# Patient Record
Sex: Male | Born: 1975 | Race: White | Hispanic: No | Marital: Married | Smoking: Never smoker
Health system: Southern US, Community
[De-identification: ages and names within clinical notes are randomized; demographics above are authoritative.]

## PROBLEM LIST (undated history)

## (undated) DIAGNOSIS — N289 Disorder of kidney and ureter, unspecified: Secondary | ICD-10-CM

## (undated) DIAGNOSIS — N200 Calculus of kidney: Secondary | ICD-10-CM

## (undated) HISTORY — PX: VASECTOMY: SHX75

## (undated) HISTORY — DX: Calculus of kidney: N20.0

## (undated) HISTORY — PX: LUNG SURGERY: SHX703

---

## 1997-08-10 HISTORY — PX: EYE SURGERY: SHX253

## 2004-07-20 ENCOUNTER — Emergency Department (HOSPITAL_COMMUNITY): Admission: EM | Admit: 2004-07-20 | Discharge: 2004-07-20 | Payer: Self-pay | Admitting: Emergency Medicine

## 2004-07-21 ENCOUNTER — Emergency Department (HOSPITAL_COMMUNITY): Admission: EM | Admit: 2004-07-21 | Discharge: 2004-07-22 | Payer: Self-pay | Admitting: Emergency Medicine

## 2004-08-09 ENCOUNTER — Emergency Department (HOSPITAL_COMMUNITY): Admission: EM | Admit: 2004-08-09 | Discharge: 2004-08-09 | Payer: Self-pay | Admitting: Emergency Medicine

## 2004-09-02 ENCOUNTER — Ambulatory Visit (HOSPITAL_COMMUNITY): Admission: RE | Admit: 2004-09-02 | Discharge: 2004-09-02 | Payer: Self-pay | Admitting: Internal Medicine

## 2004-09-02 ENCOUNTER — Inpatient Hospital Stay (HOSPITAL_COMMUNITY): Admission: EM | Admit: 2004-09-02 | Discharge: 2004-09-10 | Payer: Self-pay | Admitting: Internal Medicine

## 2004-09-05 ENCOUNTER — Ambulatory Visit: Payer: Self-pay | Admitting: Internal Medicine

## 2004-09-06 ENCOUNTER — Encounter (INDEPENDENT_AMBULATORY_CARE_PROVIDER_SITE_OTHER): Payer: Self-pay | Admitting: *Deleted

## 2004-09-06 ENCOUNTER — Encounter: Payer: Self-pay | Admitting: Cardiothoracic Surgery

## 2004-09-18 ENCOUNTER — Encounter: Admission: RE | Admit: 2004-09-18 | Discharge: 2004-09-18 | Payer: Self-pay | Admitting: Cardiothoracic Surgery

## 2004-10-02 ENCOUNTER — Encounter: Admission: RE | Admit: 2004-10-02 | Discharge: 2004-10-02 | Payer: Self-pay | Admitting: Cardiothoracic Surgery

## 2010-01-24 ENCOUNTER — Ambulatory Visit (HOSPITAL_COMMUNITY): Admission: RE | Admit: 2010-01-24 | Discharge: 2010-01-24 | Payer: Self-pay | Admitting: Gastroenterology

## 2010-01-24 ENCOUNTER — Ambulatory Visit: Payer: Self-pay | Admitting: Gastroenterology

## 2010-02-12 ENCOUNTER — Encounter (INDEPENDENT_AMBULATORY_CARE_PROVIDER_SITE_OTHER): Payer: Self-pay

## 2010-07-10 ENCOUNTER — Ambulatory Visit (HOSPITAL_COMMUNITY): Admission: RE | Admit: 2010-07-10 | Discharge: 2010-07-10 | Payer: Self-pay | Admitting: Internal Medicine

## 2010-08-15 ENCOUNTER — Emergency Department (HOSPITAL_COMMUNITY)
Admission: EM | Admit: 2010-08-15 | Discharge: 2010-08-15 | Payer: Self-pay | Source: Home / Self Care | Admitting: Emergency Medicine

## 2010-08-15 LAB — BASIC METABOLIC PANEL
BUN: 17 mg/dL (ref 6–23)
CO2: 29 mEq/L (ref 19–32)
Calcium: 9.1 mg/dL (ref 8.4–10.5)
Chloride: 101 mEq/L (ref 96–112)
Creatinine, Ser: 1.11 mg/dL (ref 0.4–1.5)
GFR calc Af Amer: >60 mL/min (ref >60)
GFR calc non Af Amer: >60 mL/min (ref >60)
Glucose, Bld: 102 mg/dL — ABNORMAL HIGH (ref 70–99)
Potassium: 3.9 mEq/L (ref 3.5–5.1)
Sodium: 138 mEq/L (ref 135–145)

## 2010-08-25 LAB — URINALYSIS, ROUTINE W REFLEX MICROSCOPIC
Bilirubin Urine: NEGATIVE
Leukocytes, UA: NEGATIVE
Nitrite: NEGATIVE
Protein, ur: NEGATIVE mg/dL
Specific Gravity, Urine: >1.03 — ABNORMAL HIGH (ref 1.005–1.030)
Urine Glucose, Fasting: NEGATIVE mg/dL
Urobilinogen, UA: 0.2 mg/dL (ref 0.0–1.0)
pH: 5.5 (ref 5.0–8.0)

## 2010-08-25 LAB — URINE MICROSCOPIC-ADD ON

## 2010-09-09 NOTE — Letter (Signed)
Summary: Plan of Care, Need to Discuss  [facility]  [address]   [place], [place] [address]   Phone: [phone]  Fax: [phone]    February 12, 2010  JAYTON POPELKA [address] [place], [place]  [address] February 04, 1976   Dear Mr. Stallone,   We are writing this letter to inform you of treatment plans and/or discuss your plan of care.  We have tried several times to contact you; however, we have yet to reach you.  We ask that you please contact our office for follow-up on your gastrointestinal issues.  We can  be reached at [phone] to schedule an appointment, or to speak with someone regarding your health care needs.  Please do not neglect your health.   Sincerely,    Cloria Spring LPN  [facility] Ph: [phone]    Fax: [phone]

## 2010-09-09 NOTE — Letter (Signed)
Summary: EGD ORDER  EGD ORDER   Imported By: Ave Filter 01/24/2010 12:17:57  _____________________________________________________________________  External Attachment:    Type:   Image     Comment:   External Document

## 2010-10-26 LAB — DIFFERENTIAL
Basophils Absolute: 0.1 10*3/uL (ref 0.0–0.1)
Basophils Relative: 1 % (ref 0–1)
Eosinophils Absolute: 0.2 10*3/uL (ref 0.0–0.7)
Eosinophils Relative: 3 % (ref 0–5)
Lymphocytes Relative: 24 % (ref 12–46)
Lymphs Abs: 1.7 10*3/uL (ref 0.7–4.0)
Monocytes Absolute: 0.6 10*3/uL (ref 0.1–1.0)
Monocytes Relative: 9 % (ref 3–12)
Neutro Abs: 4.4 10*3/uL (ref 1.7–7.7)
Neutrophils Relative %: 63 % (ref 43–77)

## 2010-10-26 LAB — CBC
HCT: 38.8 % — ABNORMAL LOW (ref 39.0–52.0)
Hemoglobin: 13.2 g/dL (ref 13.0–17.0)
MCHC: 34 g/dL (ref 30.0–36.0)
MCV: 91.8 fL (ref 78.0–100.0)
Platelets: 243 10*3/uL (ref 150–400)
RBC: 4.23 MIL/uL (ref 4.22–5.81)
RDW: 13 % (ref 11.5–15.5)
WBC: 7 10*3/uL (ref 4.0–10.5)

## 2010-10-26 LAB — T-HELPER CELLS (CD4) COUNT (NOT AT ARMC)
CD4 % Helper T Cell: 52 % (ref 33–55)
CD4 T Cell Abs: 940 uL (ref 400–2700)

## 2010-12-26 NOTE — H&P (Signed)
NAMERASHAD, AULD               ACCOUNT NO.:  [id number]   MEDICAL RECORD NO.:  [id number]          PATIENT TYPE:  INP   LOCATION:  A322                          FACILITY:  [facility]   PHYSICIAN:  Kingsley Callander. Ouida Sills, MD       DATE OF BIRTH:  [birth year]   DATE OF ADMISSION:  09/02/2004  DATE OF DISCHARGE:  LH                                HISTORY & PHYSICAL   CHIEF COMPLAINT:  Left back pain and cough.   HISTORY OF PRESENT ILLNESS:  This patient is a 35 year old white male who  presented with persistent left posterior chest pain and cough.  He has had  minimal sputum production.  He has coughed up a little bit of yellow sputum  a couple of times in the morning.  He is found to have a fever to 100.9 and  a chest x-ray showing an infiltrate with consolidation in the left lower  lobe.  He originally felt sick in early December.  He was seen in the  emergency room twice.  He was first diagnosed with a strep throat and was  treated Bicillin-LA.  He was seen a second time and diagnosed with a  pneumonia.  He had undergone a CT scan of the abdomen because of jaundice  and elevated transaminases.  His chest x-ray had shown no infiltrate.  His  CAT scan of the abdomen though revealed a left lower lobe infiltrate.  He  was treated with a course of Levaquin.  He now presents with persistent  cough which had been evaluated at an urgent care center last week.  He had  reportedly had a negative x-ray there.  I had seen him, on August 26, 2004,  and had treated him with Ketek 800 mg q.h.s. and Tussionex.  He does not  smoke.  He has no history of asthma or pulmonary problems.  His jaundice did  resolve after treatment of his pneumonia.  There had been a questionable  penicillin allergy as well.   PAST MEDICAL HISTORY:  Negative.   MEDICATIONS:  Recent Ketek and Tussionex only.   ALLERGIES:  PENICILLIN.   SOCIAL HISTORY:  He does not smoke cigarettes, abuse alcohol, or use drugs.   FAMILY HISTORY:   His mother had polymyositis.  His father has had coronary  heart disease.   REVIEW OF SYSTEMS:  Otherwise negative.   PHYSICAL EXAMINATION:  VITAL SIGNS:  Temperature 100.9.  GENERAL:  Uncomfortable, young, white male.  HEENT:  No scleral icterus.  TMs and pharynx unremarkable.  NECK:  Supple without adenopathy or thyromegaly.  LUNGS:  Dullness is present in the left base.  No wheezes.  HEART:  Regular at 100 beats per minute with no murmurs.  ABDOMEN:  Nontender.  No hepatosplenomegaly.  EXTREMITIES:  No cyanosis, clubbing, or edema.  NEURO:  Grossly intact.  LYMPH NODES:  No enlargement.  SKIN:  Normal.   IMPRESSION:  Left lower lobe pneumonia.   He will be hospitalized and treated with IV Rocephin and IV Zithromax.  Blood and sputum cultures will be obtained.  His oxygen saturation,  CBC, and  __________  are pending.      ROF/MEDQ  D:  09/02/2004  T:  09/02/2004  Job:  [id number]

## 2010-12-26 NOTE — Discharge Summary (Signed)
[name], [name]               ACCOUNT NO.:  [id number]   MEDICAL RECORD NO.:  [id number]          PATIENT TYPE:  INP   LOCATION:  6704                         FACILITY:  [facility]   PHYSICIAN:  Kingsley Callander. Ouida Sills, MD       DATE OF BIRTH:  1975/11/30   DATE OF ADMISSION:  09/02/2004  DATE OF DISCHARGE:  02/01/2006LH                                 DISCHARGE SUMMARY   DISCHARGE DIAGNOSES:  1.  Pneumonia.  2.  Multiloculated pleural effusions.  3.  Hypokalemia.  4.  Diarrhea.  5.  Gilbert's syndrome.   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 He was febrile to 100.9.  His chest x-ray revealed an infiltrate in the left lower lobe. He had  experienced persistent cough following treatment of a pneumonia in December.  He had developed jaundice then which resolved. He was hospitalized and  treated with IV Rocephin and IV Zithromax. His white count was 24.6. He was  hypokalemic at 3.1 but normalized to 3.8 with supplementation. His blood  cultures were negative. His initial bilirubin was 2.0 and improved to 1.6.   A CAT scan of the chest revealed a large left pleural effusion. He underwent  a thoracentesis by radiology. His white count on his pleural fluid was  10,600 with 89% neutrophils. The fluid was clear and yellow. The pleural  effusion could not be completely drained. Therefore, his case was discussed  with Dr. Tyrone Sage. It was felt that he had multiloculated areas of pleural  fluid. He was transferred to [facility] for additional treatment and  drainage of this fluid.      ROF/MEDQ  D:  10/03/2004  T:  10/03/2004  Job:  [id number]

## 2010-12-26 NOTE — H&P (Signed)
[name], [name] NO.:  [id number]   MEDICAL RECORD NO.:  [id number]          PATIENT TYPE:  INP   LOCATION:  2303                         FACILITY:  [facility]   PHYSICIAN:  Sheliah Plane, MD    DATE OF BIRTH:  04/08/1976   DATE OF ADMISSION:  09/05/2004  DATE OF DISCHARGE:                                HISTORY & PHYSICAL   The patient is admitted in transfer at the request of Dr. Carylon Perches, [facility], where the patient was admitted on September 02, 2004.   CHIEF COMPLAINT:  Left chest empyema with left back pain.   HISTORY OF PRESENT ILLNESS:  The patient is a 35 year old white male who has  no previous significant medical history who presented on July 20, 2004,  with sore throat and malaise.  He was seen in the emergency room at [facility] on several occasions on July 20, 2004 and again on July 22, 2004 and was given Bicillin.  The patient was noted to have prior to this a  penicillin allergy.  After the initial admission at [facility], he was noted  to have elevated transaminases and bilirubin up to 8.  A CT scan of the  abdomen was performed which showed a very minor left lower lobe infiltrate.  He was treated with a course of Levaquin.  In mid-January he was again  evaluated in the Urgent Care Center, but we do not have a chest x-ray at  that time.  He was again seen on August 26, 2004 and was treated with  Tussionex and Ketek.  Because of persistent left back pain, he saw Dr.  Ouida Sills, a chest x-ray was done and the patient was admitted because of  increasing left effusion.  A CT scan showed an area of complex loculated  effusion located in the left posterior chest cavity suggestive of empyema.  Attempt at thoracentesis was not successful.  The patient overall feels  better since admission on September 02, 2004 but continues to have a white  count of 21,000 and episodic fevers to 102.   PAST MEDICAL HISTORY:  Negative for any  previous pulmonary infections,  negative for diabetes.   MEDICATIONS:  Ketek and Tussionex recently.   ALLERGIES:  PENICILLIN.   SOCIAL HISTORY:  The patient works as a Environmental consultant, does not smoke,  does not use any illicit drugs.   FAMILY HISTORY:  His mother has polymyositis.  Father has had coronary  artery disease.   REVIEW OF SYSTEMS:  The patient denies any TIAs, amaurosis, has had some  loose stools since being on antibiotics.  Denies any blood in his stool.  Denies any pedal edema.  Other review of systems is negative.   PHYSICAL EXAMINATION:  On arrival at [facility], his temperature is 99,  blood pressure 135/55, room air O2 saturation is 98%, respiratory rate 20.   CT scan of the chest done at [facility] is reviewed, which shows left lower  lobe atelectasis/infiltrate with a large left pleural effusion which is  loculated, mildly enlarged pretracheal lymph nodes.  IMPRESSION:  1.  Patient with a left empyema following pneumonia, left lower lobe.  2.  History of transient elevation of bilirubin of unknown etiology.   PLAN:  1.  With the patient's persistent intermittent fevers and elevated white      count with left lower lobe atelectasis and complex pleural collection of      fluid, I have recommended that we proceed with left video-assisted      thoracoscopy and drainage of empyema cavity.  2.  Pulmonary consultation.  3.  Obtain appropriate cultures and adjust antibiotic therapy accordingly.   This has all been discussed with the patient and his family in detail, and  he is willing to proceed.  We will keep the patient n.p.o. tonight and  proceed first thing in the morning.      EG/MEDQ  D:  09/05/2004  T:  09/05/2004  Job:  [id number]   cc:   Kingsley Callander. Ouida Sills, MD  [address]  [place]  [place] [address]  Fax: [phone]

## 2010-12-26 NOTE — Op Note (Signed)
[name], [name]               ACCOUNT NO.:  [id number]   MEDICAL RECORD NO.:  [id number]          PATIENT TYPE:  INP   LOCATION:  2303                         FACILITY:  [facility]   PHYSICIAN:  Sheliah Plane, MD    DATE OF BIRTH:  1976/01/02   DATE OF PROCEDURE:  09/06/2004  DATE OF DISCHARGE:                                 OPERATIVE REPORT   PREOPERATIVE DIAGNOSIS:  Left chest empyema.   POSTOPERATIVE DIAGNOSIS:  Left chest empyema.   SURGICAL PROCEDURE:  Bronchoscopy, left video-assisted thoracoscopy, mini  thoracotomy with drainage of empyema and decortication of the left lung.   SURGEON:  Sheliah Plane, M.D.   BRIEF HISTORY:  The patient is a 35 year old male who presented with 6-week  long history of intermittent fevers, chest discomfort, and chest x-ray  consistent with pneumonia and empyema.  Bronchoscopy and VATS drainage of  empyema has been recommended.  The patient agreed and signed informed  consent.   DESCRIPTION OF PROCEDURE:  With central line and arterial line in place, the  patient underwent general endotracheal anesthesia without incident.  Through  the single lumen endotracheal tube, the fiberoptic bronchoscope was passed  to the subsegmental level.  There was some erythema of the bronchial mucosa  but no endobronchial lesions were appreciated, no areas of obstruction.  The  scope was removed.  The patient was turned in the lateral decubitus position  with the left side up.  Initially a small incision was made in the mid-  axillary line about the sixth intercostal space.  Through this a trocar was  placed into the left chest cavity.  There was extensive loculations  effusions throughout the space.  The lung did collapse nicely.  With the  scope, some of these loculations could be broken up.  A small counter-  incision was made more posteriorly over the majority of his involved area.  With the scope and through the small incision, decortication of the  lung and  pleural space was carried out with drainage.  Specimens of the fluid and  necrotic debris were sent to pathology.  The area was copiously irrigated.  Two 28 chest tubes were then left in placed to drain and brought out one  through the scope site and one separate incision.  A single intercostal  stitch was placed.  The muscle layers were closed with interrupted 0 Vicryl,  running 3-0 Vicryl in subcutaneous tissues, and 4-0 subcuticular stitches in  skin edges.  Dry dressing were applied.  Sponge and needle  count was reported as correct at the completion of the procedure.  The  patient tolerated the procedure without obvious complication.  Blood loss  was approximately 100 mL.  He was extubated in the operating room,  transferred to the recovery room for further postoperative care.      EG/MEDQ  D:  09/06/2004  T:  09/06/2004  Job:  [id number]   cc:   Kingsley Callander. Ouida Sills, MD  [address]  [place]  [place] [address]  Fax: [phone]

## 2010-12-26 NOTE — Discharge Summary (Signed)
[name], [name] NO.:  [id number]   MEDICAL RECORD NO.:  [id number]          PATIENT TYPE:  INP   LOCATION:  3301                         FACILITY:  [facility]   PHYSICIAN:  Sheliah Plane, MD    DATE OF BIRTH:  May 15, 1976   DATE OF ADMISSION:  09/05/2004  DATE OF DISCHARGE:  09/10/2004                                 DISCHARGE SUMMARY   HISTORY OF PRESENT ILLNESS:  The patient is a 35 year old male referred in  transfer to Sheliah Plane, M.D. from [facility] where he was  admitted on September 02, 2004 and diagnostic evaluation revealed an empyema  that was felt to require surgical intervention.  The patient was originally  seen in the emergency department on July 20, 2004 where he was lethargic  and found to have a strep throat and received Bicillin and was noted 2 days  later to have become jaundiced and also to have developed a pneumonia.  He  received Levaquin for 7 days.  He continued to have some lethargy,  nonproductive cough, and intermittent fevers over the next 3 weeks.  Approximately some time last week, he developed some back pain for which he  took Motrin.  He saw his primary care physician 1 week ago who placed him on  5 days of __________  and he returned to his primary care physician on  September 02, 2004 and was found to have pneumonia and he was admitted to  [facility] and placed on intravenous Zithromax and Rocephin.  A CT  scan was obtained and he was found to have an empyema and, as stated, was  felt to require transfer to [facility] for surgical evaluation and  treatment.   PAST MEDICAL HISTORY:  No significant medications prior to admission none  other than the above-mentioned antibiotics.   ALLERGIES:  PENICILLIN REPORTEDLY CAUSES VOMITING.   SOCIAL HISTORY:  He works as a Medical laboratory scientific officer.  Denies alcohol or  drugs.   FAMILY HISTORY:  Positive for early coronary disease, his mother has  muscular  dystrophy.   PHYSICAL EXAMINATION:  Please see the history and physical done at the time  of admission.   HOSPITAL COURSE:  The patient was admitted.  He was seen and evaluated by  Dr. Tyrone Sage who felt that the patient would require surgical intervention,  specifically bronchoscopy and left video assisted thoracoscopy with  decortication of the left lung.  This procedure was scheduled on September 06, 2004.  The patient tolerated the procedure well.  Cultures and pathology of  the pleural fluid were obtained.   POSTOPERATIVE HOSPITAL COURSE:  The patient has been stable postoperatively.  His chest tubes have been discontinued in a step-wise manner as drainage has  lowered.  His chest x-ray has revealed gradual improvement in appearance.  He has remained hemodynamically stable.  He has defervesced.  Cultures have  shown no growth of significance.  He has been treated with intravenous  Primaxin during the postoperative period.  He has responded well to  aggressive pulmonary toilet and oxygen saturations are good on  room air.  His pathology of the left pleural peel just shows marked suppurative  inflammation and fibrin.  These findings are consistent with empyema and no  definitive viable pleural tissue was identified.  The patient overall is  felt to be progressing well from a clinical viewpoint and tentatively is  scheduled for discharge on the morning of September 10, 2004, pending morning  round reevaluation.   DISCHARGE MEDICATIONS:  1.  Antibiotics to be determined at time of discharge.  2.  For pain, Tylox one to two q.4-6h. p.r.n.  3.  He also will be given a prescription for Tussionex p.r.n. 5 mL q.12h.   ACTIVITY:  He is instructed not to drive until cleared by the surgeon.  He  is to increase his walking daily as able and continue his breathing  exercises.   DIET:  Regular.   WOUND CARE:  The patient may shower and clean his incision gently with soap  and water.    FOLLOWUP:  Followup will be arranged and the patient will see Dr. Jaynie Collins in  1 week, Thursday, September 18, 2004 at 2:20 p.m. with a chest x-ray to be  obtained at the [facility] prior to this appointment.   CONDITION ON DISCHARGE:  Stable and improved.   FINAL DIAGNOSIS:  Left lung empyema probably due to community-acquired  pneumonia, all cultures thus far negative.      WEG/MEDQ  D:  09/09/2004  T:  09/09/2004  Job:  [id number]   cc:   Sheliah Plane, MD  [address]  [place]  [place] [address]   Charlaine Dalton. Sherene Sires, M.D. [facility]   Kingsley Callander. Ouida Sills, MD  [address]  [place]  [place] [address]  Fax: [phone]

## 2012-06-02 ENCOUNTER — Encounter (HOSPITAL_COMMUNITY): Payer: Self-pay

## 2012-06-02 ENCOUNTER — Emergency Department (HOSPITAL_COMMUNITY)
Admission: EM | Admit: 2012-06-02 | Discharge: 2012-06-02 | Disposition: A | Discharge status: Home / Self Care | Payer: 59 | Attending: Emergency Medicine | Admitting: Emergency Medicine

## 2012-06-02 DIAGNOSIS — Z79899 Other long term (current) drug therapy: Secondary | ICD-10-CM | POA: Insufficient documentation

## 2012-06-02 DIAGNOSIS — Z9889 Other specified postprocedural states: Secondary | ICD-10-CM | POA: Insufficient documentation

## 2012-06-02 DIAGNOSIS — N200 Calculus of kidney: Secondary | ICD-10-CM | POA: Insufficient documentation

## 2012-06-02 DIAGNOSIS — N289 Disorder of kidney and ureter, unspecified: Secondary | ICD-10-CM | POA: Insufficient documentation

## 2012-06-02 HISTORY — DX: Disorder of kidney and ureter, unspecified: N28.9

## 2012-06-02 LAB — URINALYSIS, ROUTINE W REFLEX MICROSCOPIC
Glucose, UA: NEGATIVE mg/dL
Leukocytes, UA: NEGATIVE
Protein, ur: NEGATIVE mg/dL
Urobilinogen, UA: 0.2 mg/dL (ref 0.0–1.0)

## 2012-06-02 LAB — BASIC METABOLIC PANEL
CO2: 24 mEq/L (ref 19–32)
Calcium: 9.5 mg/dL (ref 8.4–10.5)
GFR calc Af Amer: >90 mL/min (ref >90)
GFR calc non Af Amer: >90 mL/min (ref >90)
Sodium: 138 mEq/L (ref 135–145)

## 2012-06-02 LAB — URINE MICROSCOPIC-ADD ON

## 2012-06-02 MED ORDER — KETOROLAC TROMETHAMINE 30 MG/ML IJ SOLN
30.0000 mg | Freq: Once | INTRAMUSCULAR | Status: AC
  Administered 2012-06-02: 30 mg via INTRAVENOUS
  Filled 2012-06-02: qty 1

## 2012-06-02 MED ORDER — SODIUM CHLORIDE 0.9 % IV SOLN
Freq: Once | INTRAVENOUS | Status: AC
  Administered 2012-06-02: 10:00:00 via INTRAVENOUS

## 2012-06-02 MED ORDER — ONDANSETRON HCL 4 MG/2ML IJ SOLN
4.0000 mg | Freq: Once | INTRAMUSCULAR | Status: AC
  Administered 2012-06-02: 4 mg via INTRAVENOUS
  Filled 2012-06-02: qty 2

## 2012-06-02 MED ORDER — PROMETHAZINE HCL 25 MG PO TABS: 25.0000 mg | ORAL_TABLET | Freq: Four times a day (QID) | ORAL | Status: DC | PRN

## 2012-06-02 MED ORDER — OXYCODONE-ACETAMINOPHEN 5-325 MG PO TABS: 1.0000 | ORAL_TABLET | ORAL | Status: AC | PRN

## 2012-06-02 MED ORDER — HYDROMORPHONE HCL PF 1 MG/ML IJ SOLN
1.0000 mg | Freq: Once | INTRAMUSCULAR | Status: AC
  Administered 2012-06-02: 1 mg via INTRAVENOUS
  Filled 2012-06-02: qty 1

## 2012-06-02 NOTE — ED Notes (Signed)
Pt reports right flank pain since 8am today, +nausea no vomiting, h/o stones.

## 2012-06-02 NOTE — ED Notes (Signed)
Patient with no complaints at this time. Respirations even and unlabored. Skin warm/dry. Discharge instructions reviewed with patient at this time. Patient given opportunity to voice concerns/ask questions. IV removed per policy and band-aid applied to site. Patient discharged at this time and left Emergency Department with steady gait.  

## 2012-06-02 NOTE — ED Notes (Signed)
Patient given urinal and advised we needed a urine specimen. 

## 2012-06-02 NOTE — ED Provider Notes (Signed)
History     CSN: [id number]  Arrival date & time 06/02/12  4098   First MD Initiated Contact with Patient 06/02/12 [phone]      Chief Complaint  Patient presents with  . Flank Pain    (Consider location/radiation/quality/duration/timing/severity/associated sxs/prior treatment) HPI Comments: [name] presents for evaluation of sudden onset right flank pain occurring at 8 AM this morning in addition to inability to urinate.  He has had nausea without vomiting and denies fevers at this time.  He does have a history of kidney stones stating last had symptoms approximately 2 years ago.  He has never seen a urologist for this problem but has never had a problem passing stones.  He has taken no medications prior to arrival.  He denies abdominal pain and has had no changes in bowel habits including diarrhea or constipation.  Since his arrival he states his pain has changed location and is no longer in his right flank but has moved lower with radiation into his right testicle.  The history is provided by the patient.    Past Medical History  Diagnosis Date  . Renal disorder     Past Surgical History  Procedure Date  . Eye surgery   . Lung surgery   . Vasectomy     No family history on file.  History  Substance Use Topics  . Smoking status: Never Smoker   . Smokeless tobacco: Not on file  . Alcohol Use: Yes     occ      Review of Systems  Constitutional: Negative for fever.  HENT: Negative for congestion, sore throat and neck pain.   Eyes: Negative.   Respiratory: Negative for chest tightness and shortness of breath.   Cardiovascular: Negative for chest pain.  Gastrointestinal: Negative for nausea and abdominal pain.  Genitourinary: Positive for flank pain and difficulty urinating. Negative for frequency, hematuria, discharge and scrotal swelling.  Musculoskeletal: Negative for joint swelling and arthralgias.  Skin: Negative.  Negative for rash and wound.    Neurological: Negative for dizziness, weakness, light-headedness, numbness and headaches.  Hematological: Negative.   Psychiatric/Behavioral: Negative.     Allergies  Penicillins  Home Medications   Current Outpatient Rx  Name Route Sig Dispense Refill  . B-12 500 MCG PO TABS Oral Take 1 tablet by mouth 2 (two) times a week.    Marland Kitchen ESCITALOPRAM OXALATE 10 MG PO TABS Oral Take 5-10 mg by mouth daily.    . OXYCODONE-ACETAMINOPHEN 5-325 MG PO TABS Oral Take 1 tablet by mouth every 4 (four) hours as needed for pain. 20 tablet 0  . PROMETHAZINE HCL 25 MG PO TABS Oral Take 1 tablet (25 mg total) by mouth every 6 (six) hours as needed for nausea. 30 tablet 0    BP 111/55  Pulse 72  Temp 97.8 F (36.6 C) (Oral)  Resp 18  Ht 5\' 10"  (1.778 m)  Wt 180 lb (81.647 kg)  BMI 25.83 kg/m2  SpO2 100%  Physical Exam  Nursing note and vitals reviewed. Constitutional: He appears well-developed and well-nourished. He appears distressed.       Patient very uncomfortable upon first presentation, unable to sit still secondary to pain.  HENT:  Head: Normocephalic and atraumatic.  Eyes: Conjunctivae normal are normal.  Neck: Normal range of motion.  Cardiovascular: Normal rate, regular rhythm, normal heart sounds and intact distal pulses.   Pulmonary/Chest: Effort normal and breath sounds normal. He has no wheezes.  Abdominal: Soft. Normal appearance and  bowel sounds are normal. There is no tenderness. There is no rigidity, no guarding, no CVA tenderness, no tenderness at McBurney's point and negative Murphy's sign.       Pain located in lower right flank which is not exacerbated with palpation.    Musculoskeletal: Normal range of motion.  Neurological: He is alert.  Skin: Skin is warm and dry.  Psychiatric: He has a normal mood and affect.    ED Course  Procedures (including critical care time)  Labs Reviewed  URINALYSIS, ROUTINE W REFLEX MICROSCOPIC - Abnormal; Notable for the following:     Specific Gravity, Urine >1.030 (*)     Hgb urine dipstick LARGE (*)     Ketones, ur TRACE (*)     All other components within normal limits  BASIC METABOLIC PANEL - Abnormal; Notable for the following:    Glucose, Bld 106 (*)     All other components within normal limits  URINE MICROSCOPIC-ADD ON   No results found.   1. Kidney stone on right side     IV started and patient given Dilaudid 1 mg IV with mild improvement in pain, repeated Dilaudid x1 with again some improvement but not completely resolved.  Patient fairly drowsy, added Toradol 30 mg IV with complete resolution of symptoms.  MDM  Labs reviewed prior to discharge home and discussed with patient.  No UTI present.  Kidney function normal.  Discussed pros and cons of CT scan versus watchful waiting with patient and patient agrees with plan not to get a CT scan today.  Stones historically have been tiny as seen on prior CT scan, and since locus of pain has moved significantly during his ED visit, I suspect this is a small stone which should pass without any difficulty.  He was prescribed oxycodone and Phenergan for pain and nausea relief.  He was also referred to Dr. Jerre Simon for further management.  In the interim told patient that he should return here if he has increased pain, uncontrolled vomiting or if he develops fevers.  He was given a urine strainer and told to retain the stone if it passes for evaluation by Dr. Jerre Simon.  All questions were answered prior to discharge home.        Burgess Amor, [place] 06/02/12 1724

## 2012-06-02 NOTE — ED Notes (Signed)
Rated pain 8/10 at current, additional meds given for pain

## 2012-06-03 NOTE — ED Provider Notes (Signed)
Medical screening examination/treatment/procedure(s) were performed by non-physician practitioner and as supervising physician I was immediately available for consultation/collaboration.   Carleene Cooper III, MD 06/03/12 [phone]

## 2012-06-04 ENCOUNTER — Encounter (HOSPITAL_COMMUNITY): Payer: Self-pay | Admitting: *Deleted

## 2012-06-04 ENCOUNTER — Emergency Department (HOSPITAL_COMMUNITY): Payer: 59

## 2012-06-04 ENCOUNTER — Emergency Department (HOSPITAL_COMMUNITY)
Admission: EM | Admit: 2012-06-04 | Discharge: 2012-06-04 | Disposition: A | Discharge status: Home / Self Care | Payer: 59 | Attending: Emergency Medicine | Admitting: Emergency Medicine

## 2012-06-04 DIAGNOSIS — N200 Calculus of kidney: Secondary | ICD-10-CM | POA: Insufficient documentation

## 2012-06-04 DIAGNOSIS — Z87442 Personal history of urinary calculi: Secondary | ICD-10-CM | POA: Insufficient documentation

## 2012-06-04 DIAGNOSIS — N289 Disorder of kidney and ureter, unspecified: Secondary | ICD-10-CM | POA: Insufficient documentation

## 2012-06-04 LAB — URINALYSIS, ROUTINE W REFLEX MICROSCOPIC
Bilirubin Urine: NEGATIVE
Ketones, ur: NEGATIVE mg/dL
Nitrite: NEGATIVE
Protein, ur: NEGATIVE mg/dL
Urobilinogen, UA: 0.2 mg/dL (ref 0.0–1.0)

## 2012-06-04 MED ORDER — ONDANSETRON HCL 4 MG/2ML IJ SOLN
4.0000 mg | Freq: Once | INTRAMUSCULAR | Status: AC
  Administered 2012-06-04: 4 mg via INTRAVENOUS
  Filled 2012-06-04: qty 2

## 2012-06-04 MED ORDER — HYDROMORPHONE HCL PF 1 MG/ML IJ SOLN
1.0000 mg | Freq: Once | INTRAMUSCULAR | Status: AC
  Administered 2012-06-04: 1 mg via INTRAVENOUS
  Filled 2012-06-04: qty 1

## 2012-06-04 NOTE — ED Provider Notes (Signed)
Care assumed from Dr. Bebe Shaggy at shift change.  I agree with his note, assessment, and plan.  The patient was signed out to me awaiting the results of a ct scan for suspected renal calculus.  This was performed and revealed a partially-obstructing 1 mm stone at the right uvj.  He is feeling better with medications given.  He will be discharged with increased fluid intake, continued pain medications as prescribed.  Geoffery Lyons, MD 06/04/12 [phone]

## 2012-06-04 NOTE — ED Notes (Signed)
Pt was seen here on Thursday. Pt reports increase in pain that started during the night. Some nausea but has taken meds for nausea.

## 2012-06-04 NOTE — ED Provider Notes (Signed)
History     CSN: [id number]  Arrival date & time 06/04/12  7829   First MD Initiated Contact with Patient 06/04/12 [phone]      Chief Complaint  Patient presents with  . Flank Pain     Patient is a 36 y.o. male presenting with flank pain. The history is provided by the patient.  Flank Pain This is a recurrent problem. The current episode started 2 days ago. The problem occurs constantly. The problem has been gradually worsening. Pertinent negatives include no chest pain and no shortness of breath. Nothing aggravates the symptoms. The symptoms are relieved by narcotics. Treatments tried: narcotics. The treatment provided mild relief.  PT reports right flank pain.  He reports he has had this for two days.  He reports he was seen recently in the ER for this pain, and was told this was likely kidney stones.  He has h/o kidney stones but has never required urologic intervention.  He reports chills but no recorded fever.  He reports nausea/vomiting.  No cp/sob.  No focal weakness.  No lower abdominal/groin pain reported at this time  He reports he had some improvement in pain with oral narcotics but then the pain returned  Past Medical History  Diagnosis Date  . Renal disorder     Past Surgical History  Procedure Date  . Eye surgery   . Lung surgery   . Vasectomy     No family history on file.  History  Substance Use Topics  . Smoking status: Never Smoker   . Smokeless tobacco: Not on file  . Alcohol Use: Yes     occ      Review of Systems  Constitutional: Negative for fever.  Respiratory: Negative for shortness of breath.   Cardiovascular: Negative for chest pain.  Genitourinary: Positive for flank pain.  Neurological: Negative for weakness.  All other systems reviewed and are negative.    Allergies  Penicillins  Home Medications   Current Outpatient Rx  Name Route Sig Dispense Refill  . B-12 500 MCG PO TABS Oral Take 1 tablet by mouth 2 (two) times a week.      Marland Kitchen ESCITALOPRAM OXALATE 10 MG PO TABS Oral Take 5-10 mg by mouth daily.    . OXYCODONE-ACETAMINOPHEN 5-325 MG PO TABS Oral Take 1 tablet by mouth every 4 (four) hours as needed for pain. 20 tablet 0  . PROMETHAZINE HCL 25 MG PO TABS Oral Take 1 tablet (25 mg total) by mouth every 6 (six) hours as needed for nausea. 30 tablet 0    BP 111/72  Pulse 69  Temp 98.9 F (37.2 C) (Oral)  Resp 18  Ht 5\' 9"  (1.753 m)  Wt 180 lb (81.647 kg)  BMI 26.58 kg/m2  SpO2 100%  Physical Exam CONSTITUTIONAL: Well developed/well nourished, uncomfortable appearing HEAD AND FACE: Normocephalic/atraumatic EYES: EOMI/PERRL ENMT: Mucous membranes moist NECK: supple no meningeal signs SPINE:entire spine nontender CV: S1/S2 noted, no murmurs/rubs/gallops noted LUNGS: Lungs are clear to auscultation bilaterally, no apparent distress ABDOMEN: soft, nontender, no rebound or guarding HY:QMVHQ cva tenderness NEURO: Pt is awake/alert, moves all extremitiesx4 EXTREMITIES: pulses normal, full ROM SKIN: warm, color normal PSYCH: no abnormalities of mood noted  ED Course  Procedures  Labs Reviewed  URINALYSIS, ROUTINE W REFLEX MICROSCOPIC   6:53 AM Pt here for recurrent right flank pain.  Suspect worsening renal colic.  He reports similar to pain when he has been diagnosed with renal stones in past.  On his  recent ED visit his pain improved and no imaging ordered as he has h/o stones and had hematuria.  Will obtain CT imaging today due to return of pain/vomiting.  Will also recheck urine.  His recent BMP was reviewed and unremarkable   MDM  Nursing notes including past medical history and social history reviewed and considered in documentation Previous records reviewed and considered - recent ED visits reviewed         Joya Gaskins, MD 06/04/12 [phone]

## 2012-10-03 ENCOUNTER — Ambulatory Visit (INDEPENDENT_AMBULATORY_CARE_PROVIDER_SITE_OTHER): Payer: 59 | Admitting: Sports Medicine

## 2012-10-03 VITALS — BP 120/64 | Ht 70.0 in | Wt 180.0 lb

## 2012-10-03 DIAGNOSIS — M7702 Medial epicondylitis, left elbow: Secondary | ICD-10-CM

## 2012-10-04 ENCOUNTER — Other Ambulatory Visit: Payer: Self-pay | Admitting: *Deleted

## 2012-10-04 MED ORDER — DICLOFENAC SODIUM 75 MG PO TBEC: DELAYED_RELEASE_TABLET | ORAL | Status: DC

## 2012-10-04 NOTE — Progress Notes (Signed)
  Subjective:    Patient ID: [name], male    DOB: 09-13-1975, 37 y.o.   MRN: [id number]  HPI chief complaint: Left elbow pain  Very pleasant 37 year old right-hand-dominant male comes in today complaining of 2 months of medial sided left elbow pain. 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 He only inciting event that he can recall was using a post hole digger for several hours a day or 2 before he began to notice discomfort. Over the past 2 months he has experienced intermittent left elbow pain which is worse with activity. He's not noticed any swelling. He is getting some occasional numbness along the ulnar nerve distribution into his forearm and ulnar hand. This appears to be positional such as with driving or holding a phone. Repositioning the arm quickly alleviates his numbness. He is not experiencing any weakness. He has tried an elbow compression sleeve. Is also tried intermittent doses of over-the-counter ibuprofen. No neck pain. Denies pain along the lateral elbow. No prior elbow surgery.  Medications include Lexapro 10 mg daily He is allergic to penicillin Socially he does not smoke, drinks alcohol on occasional basis Occupation is as above   Review of Systems     Objective:   Physical Exam Well-developed, fit-appearing 37 year old male in no acute distress. Awake alert and oriented x3.  Left elbow: Full range of motion with no effusion. There is discrete tenderness to palpation over the medial epicondyle. No soft tissue swelling. No tenderness to palpation over the lateral epicondyle or over the olecranon. Negative Tinel's at the cubital tunnel. No tenderness to palpation along the forearm flexor mass. No atrophy. Good radial and ulnar pulses. Good grip strength. Sensation is intact to light touch grossly.  MSK ultrasound of the left elbow: A limited exam of the medial elbow was performed. Common flexor tendon is  intact the medial epicondyles without any obvious tearing. Some slight hypoechogenicity however suggests some inflammation at the origin. No spurs appreciated. There is increased neovascularity along the medial epicondyle suggesting a stress reaction here.      Assessment & Plan:  1. Left elbow pain secondary to medial epicondylitis  The ultrasound does not show any discrete tearing of the common flexor tendon but the increased neovascularity at the medial coupled with his physical exam finding are strongly supportive of the diagnosis of medial epicondylitis. We discussed treatment options today including oral anti-inflammatories or cortisone injection. We are going to try Voltaren 75 mg twice daily with food for 7 days. I've given him a counterforce brace to wear with activity and I've asked him to try to refrain from repetitive use of his left elbow until symptoms improve. He is given a set of eccentric home exercises. He will followup in 3 weeks but if symptoms do not respond initially to the oral anti-inflammatory he may return sooner for reconsideration of a cortisone injection.

## 2012-10-24 ENCOUNTER — Ambulatory Visit: Payer: 59 | Admitting: Sports Medicine

## 2012-12-21 ENCOUNTER — Ambulatory Visit: Payer: 59 | Admitting: Family Medicine

## 2012-12-26 ENCOUNTER — Ambulatory Visit (INDEPENDENT_AMBULATORY_CARE_PROVIDER_SITE_OTHER): Payer: 59 | Admitting: Sports Medicine

## 2012-12-26 VITALS — BP 120/70 | Ht 70.0 in | Wt 180.0 lb

## 2012-12-26 DIAGNOSIS — M7700 Medial epicondylitis, unspecified elbow: Secondary | ICD-10-CM

## 2012-12-26 DIAGNOSIS — M7702 Medial epicondylitis, left elbow: Secondary | ICD-10-CM

## 2012-12-26 DIAGNOSIS — G8929 Other chronic pain: Secondary | ICD-10-CM

## 2012-12-26 DIAGNOSIS — M25529 Pain in unspecified elbow: Secondary | ICD-10-CM

## 2012-12-27 NOTE — Progress Notes (Signed)
  Subjective:    Patient ID: [name], male    DOB: Mar 30, 1976, 37 y.o.   MRN: [id number]  HPI Patient comes in today with persistent medial left elbow pain. Symptoms have not been present for several months. Despite Voltaren, counterforce bracing, and a home exercise program his symptoms persist. He describes a burning sensation in the medial elbow. Present with activity. Still getting some intermittent numbness and tingling along the ulnar nerve distribution of his forearm and hand but he continues to be positional and is not any worse than it was previously. He is not noticed any significant weakness.  Interim medical history is unchanged He is allergic to penicillin    Review of Systems     Objective:   Physical Exam Well-developed, fit-appearing. No acute distress   Left elbow: Full range of motion. No effusion. No soft tissue swelling. There is discrete tenderness to palpation at the medial epicondyle with reproducible pain with resisted wrist flexion and ulnar deviation. No tenderness over the lateral epicondyle. Negative Tinel's at the cubital tunnel. No atrophy. Good radial and ulnar pulses. Good grip strength.       Assessment & Plan:  1. 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 Patient tolerated the procedure without difficulty. I've asked him to try to rest the elbow is much as possible over the next 5-7 days. He is to continue with his home exercises. An ultrasound at his last visit showed no obvious tearing of the common flexor tendon but if his symptoms persist I would consider merits of further diagnostic imaging in the form of an MRI to evaluate further. He will followup with me in 4 weeks or sooner if needed.  Consent obtained and verified. Time-out conducted. Noted no overlying  erythema, induration, or other signs of local infection. Skin prepped in a sterile fashion. Topical analgesic spray: Ethyl chloride. Joint: left medial elbow Needle: 25g 1 1/2 inch Completed without difficulty. Meds: 1cc (40mg ) depomedrol  Advised to call if fevers/chills, erythema, induration, drainage, or persistent bleeding.

## 2013-01-16 ENCOUNTER — Ambulatory Visit: Payer: 59 | Admitting: Sports Medicine

## 2014-09-10 DIAGNOSIS — R9431 Abnormal electrocardiogram [ECG] [EKG]: Secondary | ICD-10-CM | POA: Insufficient documentation

## 2016-06-22 ENCOUNTER — Encounter (INDEPENDENT_AMBULATORY_CARE_PROVIDER_SITE_OTHER): Payer: Self-pay | Admitting: Sports Medicine

## 2016-06-22 ENCOUNTER — Ambulatory Visit (INDEPENDENT_AMBULATORY_CARE_PROVIDER_SITE_OTHER): Payer: Commercial Managed Care - HMO | Admitting: Sports Medicine

## 2016-06-22 VITALS — BP 123/74 | HR 74 | Ht 70.0 in | Wt 200.0 lb

## 2016-06-22 DIAGNOSIS — M7701 Medial epicondylitis, right elbow: Secondary | ICD-10-CM

## 2016-06-22 NOTE — Progress Notes (Signed)
[name] - 40 y.o. male MRN [id number]  Date of birth: Oct 30, 1975  Office Visit Note: Visit Date: 06/22/2016 PCP: Purvis Kilts, MD Referred by: Sharilyn Sites, MD  Subjective: Chief Complaint  Patient presents with  . Right Elbow - Follow-up  . Follow-up    Right elbow pain for about 6 months.  Hurts to extend right arm out and hold something in the right hand.  Loss of strenght in right arm.    HPI: Worsening right medial elbow pain over the past 6 months. Significant worsening over the past one week. 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 He is had issues with similar problem on the left side several years ago that responded well to injection by Dr. Micheline Chapman. He presents today for evaluation of this pain given the fact that is been interfering with his day-to-day activities at work. He is unable to carry more than one 4 x 4 fencepost which is atypical for him. He has been using manual postal diggers senses auger has been broken. This does seem to flareup in symptoms. & He has tried anti-inflammatories magnesium rubs & relative rest without improvement. He denies any significant pain radiating down past the elbow.   ROS: Otherwise per HPI.  Assessment & Plan: Visit Diagnoses:  1. Medial epicondylitis, right elbow     Plan: Discussion regarding merits of cortisone injection versus other alternative treatment was discussed. Ultimately cortisone injection will be tried & continued relative rest over the next several days as encouraged. Encouraged icing afterwards. If any persistent ongoing symptoms can consider other alternative treatments including PRP, whole blood or topical NSAIDs. Meds: No orders of the defined types were placed in this encounter. Hand/UE Inj Date/Time: 06/22/2016 4:18 PM Performed by: Teresa Coombs D Authorized by: Teresa Coombs D   Condition: medial epicondylitis   Site:  R elbow Needle  Size:  22 G Medications:  1 mL bupivacaine 0.5 %; 1 mL lidocaine 1 %; 40 mg methylPREDNISolone acetate 40 MG/ML Comments: The patient's clinical condition is marked by substantial pain and/or significant functional disability. Other conservative therapy has not provided relief, is contraindicated, or not appropriate. There is a reasonable likelihood that injection will significantly improve the patient's pain and/or functional impairment.  After discussing the risks, benefits and expected outcomes of the injection and all questions were reviewed and answered, the patient wished to undergo the above named procedure.  Verbal consent was obtained. The target sight was prepped with alcohol scrub. Local anesthesia was obtained with ethyl chloride.  Under real-time ultrasound guidance, Injection of the target structure was performed using the above needle and medications. Band-Aid and 4" Ace Wrap was applied. The patient tolerated this procedure well with no immediate complications. Post injection instructions were provided.   Limited views of the medial condyle were obtained initially that did show increased neovascularity with interstitial tearing of the common flexor tendon insertion. No significant avulsion.      Follow-up: Return if symptoms worsen or fail to improve.   Clinical History: No specialty comments available.  He reports that he has never smoked. He does not have any smokeless tobacco history on file. No results for input(s): HGBA1C, LABURIC in the last 8760 hours.  Objective:  VS:  HT:5\' 10"  (177.8 cm)   WT:200 lb (90.7 kg)  BMI:28.8    BP:123/74  HR:74bpm  TEMP: ( )  RESP:  Physical Exam: Adult male. No acute  distress. Alert & appropriate. Right upper extremity's overall well aligned. Radial pulses 2+/4. Sensation intact. No significant deformity. No overlying skin changes. Marked tenderness palpation over the medial epicondyle. Pain with resisted wrist flexion & ulnar deviation.  No palpable defect.   Imaging: No results found.

## 2016-06-26 MED ORDER — LIDOCAINE HCL 1 % IJ SOLN
1.0000 mL | INTRAMUSCULAR | Status: AC | PRN
  Administered 2016-06-22: 1 mL

## 2016-06-26 MED ORDER — METHYLPREDNISOLONE ACETATE 40 MG/ML IJ SUSP
40.0000 mg | INTRAMUSCULAR | Status: AC | PRN
  Administered 2016-06-22: 40 mg

## 2016-06-26 MED ORDER — BUPIVACAINE HCL 0.5 % IJ SOLN
1.0000 mL | INTRAMUSCULAR | Status: AC | PRN
  Administered 2016-06-22: 1 mL

## 2016-07-06 DIAGNOSIS — R1314 Dysphagia, pharyngoesophageal phase: Secondary | ICD-10-CM | POA: Insufficient documentation

## 2016-07-06 DIAGNOSIS — K219 Gastro-esophageal reflux disease without esophagitis: Secondary | ICD-10-CM | POA: Insufficient documentation

## 2016-07-08 ENCOUNTER — Other Ambulatory Visit: Payer: Self-pay | Admitting: Otolaryngology

## 2016-07-08 DIAGNOSIS — K219 Gastro-esophageal reflux disease without esophagitis: Secondary | ICD-10-CM

## 2016-07-08 DIAGNOSIS — R1314 Dysphagia, pharyngoesophageal phase: Secondary | ICD-10-CM

## 2016-07-13 ENCOUNTER — Other Ambulatory Visit: Payer: Self-pay

## 2016-07-13 ENCOUNTER — Ambulatory Visit
Admission: RE | Admit: 2016-07-13 | Discharge: 2016-07-13 | Disposition: A | Discharge status: Home / Self Care | Payer: Commercial Managed Care - HMO | Source: Ambulatory Visit | Attending: Otolaryngology | Admitting: Otolaryngology

## 2016-07-13 DIAGNOSIS — R1314 Dysphagia, pharyngoesophageal phase: Secondary | ICD-10-CM

## 2016-07-13 DIAGNOSIS — K219 Gastro-esophageal reflux disease without esophagitis: Secondary | ICD-10-CM

## 2016-07-20 ENCOUNTER — Encounter: Payer: Self-pay | Admitting: Physician Assistant

## 2016-07-27 ENCOUNTER — Encounter: Payer: Self-pay | Admitting: Physician Assistant

## 2016-07-27 ENCOUNTER — Ambulatory Visit (INDEPENDENT_AMBULATORY_CARE_PROVIDER_SITE_OTHER): Payer: Commercial Managed Care - HMO | Admitting: Physician Assistant

## 2016-07-27 VITALS — BP 110/60 | HR 84 | Ht 70.0 in | Wt 210.0 lb

## 2016-07-27 DIAGNOSIS — R933 Abnormal findings on diagnostic imaging of other parts of digestive tract: Secondary | ICD-10-CM

## 2016-07-27 DIAGNOSIS — R1314 Dysphagia, pharyngoesophageal phase: Secondary | ICD-10-CM

## 2016-07-27 NOTE — Progress Notes (Signed)
Agree with assessment and plan as outlined.  

## 2016-07-27 NOTE — Progress Notes (Signed)
Chief Complaint: Dysphagia, Abnormal Esophagram  HPI:  [name] is a 40 year old Caucasian male who presents to clinic today accompanied by his wife who does assist with his history, he was referred to me by Dr. Constance Holster for a complaint of dysphagia and abnormal barium esophagram.   Barium esophagram 07/13/16 showed an area of narrowing at the junction of the proximal and mid portions of the thoracic esophagus. It appeared to be smooth no evidence of a shelf or web. Esophageal motility in this region appeared normal and liquid barium was ingested. There was no evidence of hiatal hernia or reflux.   Also per chart reviewed it appears patient had an EGD completed 01/29/10 for painful swallowing and was diagnosed with esophagitis, cannot make out who did this and there are no other notes from GI.     Today, the patient tells me that around 2003 he had "lung issues" and was intubated, he then had an EGD for painful swallowing after this which showed a lot of esophageal ulcers. The patient tells me it was unclear what these were from but was told that they could "scar over later". He tells me that most recently over the past 6 months he has been experiencing occasional dysphagia symptoms. Typically these are to meats. The patient has been able to avoid this over the past couple of weeks as he has been taking very small bites and chewing very well. He did have a recent barium esophagram, results as above, showing narrowing in his esophagus. The patient denies any other GI symptoms.   Patient denies fever, chills, blood in the stool, melena, change in bowel habits, weight loss, fatigue, nausea, vomiting, heartburn, reflux or abdominal pain.  Past Medical History:  Diagnosis Date  . Renal disorder     Past Surgical History:  Procedure Laterality Date  . EYE SURGERY    . LUNG SURGERY    . VASECTOMY      Current Outpatient Prescriptions  Medication Sig Dispense Refill  . Cyanocobalamin (B-12) 500 MCG  TABS Take 1 tablet by mouth 2 (two) times a week.    . diclofenac (VOLTAREN) 75 MG EC tablet Take twice daily with food for 7 days, then as needed. (Patient not taking: Reported on 06/22/2016) 60 tablet 0  . escitalopram (LEXAPRO) 10 MG tablet Take 5-10 mg by mouth daily.    . modafinil (PROVIGIL) 200 MG tablet     . promethazine (PHENERGAN) 25 MG tablet Take 1 tablet (25 mg total) by mouth every 6 (six) hours as needed for nausea. (Patient not taking: Reported on 06/22/2016) 30 tablet 0   No current facility-administered medications for this visit.     Allergies as of 07/27/2016 - Review Complete 06/26/2016  Allergen Reaction Noted  . Penicillins Other (See Comments) 06/02/2012    No family history on file.  Social History   Social History  . Marital status: Divorced    Spouse name: N/A  . Number of children: N/A  . Years of education: N/A   Occupational History  . Not on file.   Social History Main Topics  . Smoking status: Never Smoker  . Smokeless tobacco: Not on file  . Alcohol use Yes     Comment: occ  . Drug use: No  . Sexual activity: Yes    Birth control/ protection: Surgical   Other Topics Concern  . Not on file   Social History Narrative  . No narrative on file    Review of Systems:  Constitutional: No weight loss, fever, chills, weakness or fatigue Skin: No rash  Cardiovascular: No chest pain Respiratory: No SOB  Gastrointestinal: See HPI and otherwise negative Genitourinary: No dysuria or change in urinary frequency Neurological: No headache Musculoskeletal: No new muscle or joint pain Hematologic: No bleeding  Psychiatric: Positive for anxiety    Physical Exam:  Vital signs: BP 110/60   Pulse 84   Ht 5\' 10"  (1.778 m)   Wt 210 lb (95.3 kg)   BMI 30.13 kg/m    Constitutional:   Pleasant Caucasian male appears to be in NAD, Well developed, Well nourished, alert and cooperative Head:  Normocephalic and atraumatic. Eyes:   PEERL, EOMI. No  icterus. Conjunctiva pink. Ears:  Normal auditory acuity. Neck:  Supple Throat: Oral cavity and pharynx without inflammation, swelling or lesion.  Respiratory: Respirations even and unlabored. Lungs clear to auscultation bilaterally.   No wheezes, crackles, or rhonchi.  Cardiovascular: Normal S1, S2. No MRG. Regular rate and rhythm. No peripheral edema, cyanosis or pallor.  Gastrointestinal:  Soft, nondistended, nontender. No rebound or guarding. Normal bowel sounds. No appreciable masses or hepatomegaly. Rectal:  Not performed.  Msk:  Symmetrical without gross deformities. Without edema, no deformity or joint abnormality.  Neurologic:  Alert and  oriented x4;  grossly normal neurologically.  Skin:   Dry and intact without significant lesions or rashes. Psychiatric: Demonstrates good judgement and reason without abnormal affect or behaviors.  RECENT IMAGING:  ESOPHOGRAM / BARIUM SWALLOW / BARIUM TABLET STUDY 07/13/16  TECHNIQUE: Combined double contrast and single contrast examination performed using effervescent crystals, thick barium liquid, and thin barium liquid. The patient was observed with fluoroscopy swallowing a 13 mm barium sulphate tablet.  FLUOROSCOPY TIME:  Fluoroscopy Time:  1 minutes, 6 seconds  Radiation Exposure Index (if provided by the fluoroscopic device): 108 0.2 mGy  Number of Acquired Spot Images: 4+ 4 video loops  COMPARISON:  None in PACs  FINDINGS: The patient ingested the thick and thin barium and effervescent crystals without difficulty. The cervical esophagus distended well. There was no laryngeal penetration of the barium. The thoracic esophagus distended well to the liquid barium. No discrete stricture was observed. The mucosal fold pattern appeared normal. In the prone position esophageal motility was good. No reflux was observed.  Upon administration of the barium tablet passed promptly to the junction of the proximal and middle thirds  of the esophagus. Here it hung despite additional swallows of water as well as barium. Distal to this a few tertiary contractions were observed.  IMPRESSION: There is an area of narrowing at the junction of the proximal and midportions of the thoracic esophagus. It appears to be smooth with no evidence of a shelf or web. Esophageal motility through this region appears normal when liquid barium its ingested. Direct visualization is recommended.  No evidence of hiatal hernia or reflux.   Electronically Signed   By: David  Martinique M.D.   On: 07/13/2016 15:50  Assessment: 1. Dysphagia: See imaging above, likely related to esophageal narrowing/stricture, patient describes intermittent symptoms over the past 6 months, better with smaller bites and chewing well, history of esophagitis, last EGD in 2011, patient unsure of exact results 2. Esophageal narrowing: As above  Plan: 1. Recommend EGD with dilation. This was scheduled Dr. Havery Moros as he is the supervising physician today. Discussed risks, benefits, limitations and alternatives the patient agrees to proceed. This is scheduled in the [facility]. 2. Reviewed anti-dysphagia measures including taking small bites, chewing well, avoiding  distraction while eating, taking sips of water between bites and the chin tuck technique 3. Will not start patient on PPIs as it does not sound as though the symptoms are related to reflux, this can be discussed after time of his EGD. 4. Patient to follow in clinic for Dr. Doyne Keel  recommendations after time of procedure.  Jeffrey Newer, PA-C [facility] 07/27/2016, 9:25 AM  Cc: Dr. Izora Gala

## 2016-07-27 NOTE — Patient Instructions (Signed)

## 2016-08-11 ENCOUNTER — Encounter: Payer: Commercial Managed Care - HMO | Admitting: Gastroenterology

## 2016-08-19 ENCOUNTER — Ambulatory Visit (AMBULATORY_SURGERY_CENTER): Payer: Commercial Managed Care - HMO | Admitting: Gastroenterology

## 2016-08-19 ENCOUNTER — Encounter: Payer: Self-pay | Admitting: Gastroenterology

## 2016-08-19 VITALS — BP 103/66 | HR 59 | Temp 99.1°F | Resp 17 | Ht 70.0 in | Wt 210.0 lb

## 2016-08-19 DIAGNOSIS — K2281 Esophageal polyp: Secondary | ICD-10-CM

## 2016-08-19 DIAGNOSIS — K228 Other specified diseases of esophagus: Secondary | ICD-10-CM | POA: Diagnosis not present

## 2016-08-19 DIAGNOSIS — R1310 Dysphagia, unspecified: Secondary | ICD-10-CM | POA: Diagnosis not present

## 2016-08-19 DIAGNOSIS — K209 Esophagitis, unspecified without bleeding: Secondary | ICD-10-CM

## 2016-08-19 DIAGNOSIS — R1314 Dysphagia, pharyngoesophageal phase: Secondary | ICD-10-CM

## 2016-08-19 MED ORDER — SODIUM CHLORIDE 0.9 % IV SOLN: 500.0000 mL | INTRAVENOUS | Status: DC

## 2016-08-19 MED ORDER — OMEPRAZOLE 40 MG PO CPDR: 40.0000 mg | DELAYED_RELEASE_CAPSULE | Freq: Every day | ORAL | 1 refills | Status: DC

## 2016-08-19 NOTE — Progress Notes (Signed)
Called to room to assist during endoscopic procedure.  Patient ID and intended procedure confirmed with present staff. Received instructions for my participation in the procedure from the performing physician.  

## 2016-08-19 NOTE — Patient Instructions (Addendum)
YOU HAD AN ENDOSCOPIC PROCEDURE TODAY AT [facility]:   Refer to the procedure report that was given to you for any specific questions about what was found during the examination.  If the procedure report does not answer your questions, please call your gastroenterologist to clarify.  If you requested that your care partner not be given the details of your procedure findings, then the procedure report has been included in a sealed envelope for you to review at your convenience later.  YOU SHOULD EXPECT: Some feelings of bloating in the abdomen. Passage of more gas than usual.  Walking can help get rid of the air that was put into your GI tract during the procedure and reduce the bloating. If you had a lower endoscopy (such as a colonoscopy or flexible sigmoidoscopy) you may notice spotting of blood in your stool or on the toilet paper. If you underwent a bowel prep for your procedure, you may not have a normal bowel movement for a few days.  Please Note:  You might notice some irritation and congestion in your nose or some drainage.  This is from the oxygen used during your procedure.  There is no need for concern and it should clear up in a day or so.  SYMPTOMS TO REPORT IMMEDIATELY:   Following lower endoscopy (colonoscopy or flexible sigmoidoscopy):  Excessive amounts of blood in the stool  Significant tenderness or worsening of abdominal pains  Swelling of the abdomen that is new, acute  Fever of 100F or higher   Following upper endoscopy (EGD)  Vomiting of blood or coffee ground material  New chest pain or pain under the shoulder blades  Painful or persistently difficult swallowing  New shortness of breath  Fever of 100F or higher  Black, tarry-looking stools  For urgent or emergent issues, a gastroenterologist can be reached at any hour by calling [phone].   DIET:  We do recommend a small meal at first, but then you may proceed to your regular diet.  Drink  plenty of fluids but you should avoid alcoholic beverages for 24 hours.  ACTIVITY:  You should plan to take it easy for the rest of today and you should NOT DRIVE or use heavy machinery until tomorrow (because of the sedation medicines used during the test).    FOLLOW UP: Our staff will call the number listed on your records the next business day following your procedure to check on you and address any questions or concerns that you may have regarding the information given to you following your procedure. If we do not reach you, we will leave a message.  However, if you are feeling well and you are not experiencing any problems, there is no need to return our call.  We will assume that you have returned to your regular daily activities without incident.  If any biopsies were taken you will be contacted by phone or by letter within the next 1-3 weeks.  Please call us at [phone] if you have not heard about the biopsies in 3 weeks.    SIGNATURES/CONFIDENTIALITY: You and/or your care partner have signed paperwork which will be entered into your electronic medical record.  These signatures attest to the fact that that the information above on your After Visit Summary has been reviewed and is understood.  Full responsibility of the confidentiality of this discharge information lies with you and/or your care-partner.    Handouts were given to your care partner on esophagitis  and GERD. Recommend OMEPRAZOLE 40 mg twice daily for 2 weeks, then once daily thereafter.  Rx was sent to [facility]. You may resume your other current medications today. Await biopsy results. Please call if any questions or concerns.

## 2016-08-19 NOTE — Progress Notes (Signed)
No problems noted in the recovery room. maw 

## 2016-08-19 NOTE — Progress Notes (Signed)
A and O x3. Report to RN. Tolerated MAC anesthesia well.Teeth unchanged after procedure.

## 2016-08-20 ENCOUNTER — Telehealth: Payer: Self-pay | Admitting: *Deleted

## 2016-08-20 NOTE — Op Note (Addendum)
[place] Patient Name: [name] Procedure Date: 08/19/2016 3:16 PM MRN: [id number] Endoscopist: Remo Lipps P. Armbruster MD, MD Age: 41 Referring MD:  Date of Birth: [birth year] Gender: Male Account #: [id number] Procedure:                Upper GI endoscopy Indications:              Dysphagia, abnormal barium swallow Medicines:                Monitored Anesthesia Care Procedure:                Pre-Anesthesia Assessment:                           - Prior to the procedure, a History and Physical                            was performed, and patient medications and                            allergies were reviewed. The patient's tolerance of                            previous anesthesia was also reviewed. The risks                            and benefits of the procedure and the sedation                            options and risks were discussed with the patient.                            All questions were answered, and informed consent                            was obtained. Prior Anticoagulants: The patient has                            taken no previous anticoagulant or antiplatelet                            agents. ASA Grade Assessment: II - A patient with                            mild systemic disease. After reviewing the risks                            and benefits, the patient was deemed in                            satisfactory condition to undergo the procedure.                           After obtaining informed consent, the endoscope was  passed under direct vision. Throughout the                            procedure, the patient's blood pressure, pulse, and                            oxygen saturations were monitored continuously. The                            Model GIF-[id number]) scope was introduced                            through the mouth, and advanced to the second part                            of duodenum.  The upper GI endoscopy was                            accomplished without difficulty. The patient                            tolerated the procedure well. Scope In: Scope Out: Findings:                 Esophagogastric landmarks were identified: the                            Z-line was found at 38 cm, the gastroesophageal                            junction was found at 38 cm and the upper extent of                            the gastric folds was found at 38 cm from the                            incisors.                           Mucosal changes including ringed esophagus,                            longitudinal furrows and small-caliber esophagus                            were found in the entire esophagus. No focal                            stenosis / stricture was appreciated. Biopsies were                            obtained from the proximal, mid, and distal                            esophagus with cold  forceps for histology of                            suspected eosinophilic esophagitis.                           LA Grade B (one or more mucosal breaks greater than                            5 mm, not extending between the tops of two mucosal                            folds) esophagitis was found at the GEJ.                           A few small diminutive benign appearing polyps were                            found in the proximal esophagus. One representative                            polyp was removed with a cold biopsy forceps.                            Resection and retrieval were complete.                           One non-bleeding superficial gastric ulcer was                            found on the lesser curvature of the gastric body.                            The lesion was 4 mm in largest dimension.                           The exam of the stomach was otherwise normal.                           Biopsies were taken with a cold forceps in the                             gastric body and in the gastric antrum for                            Helicobacter pylori testing.                           The duodenal bulb and second portion of the                            duodenum were normal. Complications:            No immediate complications. Estimated blood loss:  Minimal. Estimated Blood Loss:     Estimated blood loss was minimal. Impression:               - Esophagogastric landmarks identified.                           - Esophageal mucosal changes suspicious for                            eosinophilic esophagitis. Biopsied.                           - LA Grade B reflux esophagitis.                           - Esophageal polyp(s) were found. Resected and                            retrieved.                           - Non-bleeding gastric ulcer.                           - Normal duodenal bulb and second portion of the                            duodenum.                           - Biopsies were taken with a cold forceps for                            Helicobacter pylori testing.                           Overall, findings suggestive for eosinophilic                            esophagitis causing the patient's symptoms. Recommendation:           - Patient has a contact number available for                            emergencies. The signs and symptoms of potential                            delayed complications were discussed with the                            patient. Return to normal activities tomorrow.                            Written discharge instructions were provided to the                            patient.                           -  Resume previous diet.                           - Continue present medications.                           - If not on any antacids, recommend omeprazole 40mg                             twice daily for 2 weeks, then once daily thereafter                           - Await  pathology results.                           - Pending pathology results we may repeat upper                            endoscopy in 8 weeks to evaluate the response to                            therapy.                           - No NSAIDs Carlota Raspberry. Armbruster MD, MD 08/19/2016 3:58:08 PM This report has been signed electronically.

## 2016-08-20 NOTE — Telephone Encounter (Signed)
  Follow up Call-  Call back number 08/19/2016  Post procedure Call Back phone  # [phone]  Permission to leave phone message Yes  Some recent data might be hidden     Patient questions:  Do you have a fever, pain , or abdominal swelling? No. Pain Score  0 *  Have you tolerated food without any problems? Yes.    Have you been able to return to your normal activities? Yes.    Do you have any questions about your discharge instructions: Diet   No. Medications  No. Follow up visit  No.  Do you have questions or concerns about your Care? No.  Actions: * If pain score is 4 or above: No action needed, pain <4.

## 2016-09-01 NOTE — Progress Notes (Signed)
Letter mailed

## 2016-10-21 ENCOUNTER — Encounter: Payer: Self-pay | Admitting: Gastroenterology

## 2017-02-18 ENCOUNTER — Other Ambulatory Visit: Payer: Self-pay | Admitting: Gastroenterology

## 2017-02-18 DIAGNOSIS — R1314 Dysphagia, pharyngoesophageal phase: Secondary | ICD-10-CM

## 2017-02-18 DIAGNOSIS — K209 Esophagitis, unspecified without bleeding: Secondary | ICD-10-CM

## 2017-02-18 DIAGNOSIS — K2281 Esophageal polyp: Secondary | ICD-10-CM

## 2017-02-18 DIAGNOSIS — K228 Other specified diseases of esophagus: Secondary | ICD-10-CM

## 2017-02-25 ENCOUNTER — Other Ambulatory Visit: Payer: Self-pay | Admitting: Gastroenterology

## 2017-02-25 DIAGNOSIS — K228 Other specified diseases of esophagus: Secondary | ICD-10-CM

## 2017-02-25 DIAGNOSIS — K209 Esophagitis, unspecified without bleeding: Secondary | ICD-10-CM

## 2017-02-25 DIAGNOSIS — R1314 Dysphagia, pharyngoesophageal phase: Secondary | ICD-10-CM

## 2017-02-25 DIAGNOSIS — K2281 Esophageal polyp: Secondary | ICD-10-CM

## 2017-06-22 LAB — CBC AND DIFFERENTIAL
HCT: 43 (ref 41–53)
HEMOGLOBIN: 14.7 (ref 13.5–17.5)
NEUTROS ABS: 2778
PLATELETS: 384 (ref 150–399)
WBC: 5.6

## 2017-06-22 LAB — BASIC METABOLIC PANEL
Potassium: 4.8 (ref 3.4–5.3)
Sodium: 139 (ref 137–147)

## 2017-06-22 LAB — HEPATIC FUNCTION PANEL
ALK PHOS: 39 (ref 25–125)
ALT: 23 (ref 10–40)
AST: 23 (ref 14–40)
BILIRUBIN, TOTAL: 1.3

## 2017-06-22 LAB — LIPID PANEL
Cholesterol: 202 — AB (ref 0–200)
HDL: 57 (ref 35–70)
LDL CALC: 128
TRIGLYCERIDES: 77 (ref 40–160)

## 2017-08-26 LAB — PULMONARY FUNCTION TEST

## 2017-10-04 ENCOUNTER — Ambulatory Visit (INDEPENDENT_AMBULATORY_CARE_PROVIDER_SITE_OTHER): Payer: 59 | Admitting: Family Medicine

## 2017-10-04 ENCOUNTER — Encounter: Payer: Self-pay | Admitting: Family Medicine

## 2017-10-04 VITALS — BP 124/72 | HR 67 | Temp 98.9°F | Ht 70.0 in | Wt 213.8 lb

## 2017-10-04 DIAGNOSIS — Z0001 Encounter for general adult medical examination with abnormal findings: Secondary | ICD-10-CM | POA: Diagnosis not present

## 2017-10-04 DIAGNOSIS — F325 Major depressive disorder, single episode, in full remission: Secondary | ICD-10-CM

## 2017-10-04 DIAGNOSIS — Z114 Encounter for screening for human immunodeficiency virus [HIV]: Secondary | ICD-10-CM

## 2017-10-04 DIAGNOSIS — Z23 Encounter for immunization: Secondary | ICD-10-CM | POA: Diagnosis not present

## 2017-10-04 DIAGNOSIS — F419 Anxiety disorder, unspecified: Secondary | ICD-10-CM

## 2017-10-04 NOTE — Addendum Note (Signed)
Addended by: Elmer Bales on: 10/04/2017 03:56 PM   Modules accepted: Orders

## 2017-10-04 NOTE — Assessment & Plan Note (Signed)
Stable.  Continue lexapro.  

## 2017-10-04 NOTE — Addendum Note (Signed)
Addended by: Kayren Eaves T on: 10/04/2017 04:33 PM   Modules accepted: Orders

## 2017-10-04 NOTE — Patient Instructions (Signed)

## 2017-10-04 NOTE — Progress Notes (Signed)
Subjective:  [name] is a 42 y.o. male who presents today for his annual comprehensive physical exam and to establish care.   HPI:  He has no acute complaints today.   His stable, chronic conditions are summarized below: 1. Depression/Anxiety. Stable on lexapro for several years. No SI or HI.  Lifestyle Diet: No specific diet. Exercise: Ride bikes. Ride about 3 times per week. 15 miles at a time.   Depression screen PHQ 2/9 10/04/2017  Decreased Interest 0  Down, Depressed, Hopeless 0  PHQ - 2 Score 0   Health Maintenance Due  Topic Date Due  . HIV Screening  10/31/1990  . TETANUS/TDAP  10/31/1994    ROS: Per HPI, otherwise a complete review of systems was negative.   PMH:  The following were reviewed and entered/updated in epic: Past Medical History:  Diagnosis Date  . Kidney stone   . Renal disorder    Patient Active Problem List   Diagnosis Date Noted  . Major depression in remission ([place]) 10/04/2017  . Anxiety disorder 10/04/2017   Past Surgical History:  Procedure Laterality Date  . EYE SURGERY Bilateral 1999   Lasik  . LUNG SURGERY     from reaction to penicillin  . VASECTOMY     Family History  Problem Relation Age of Onset  . Muscular dystrophy Mother   . Diabetes Father   . Colon cancer Neg Hx   . Colon polyps Neg Hx   . Esophageal cancer Neg Hx   . Rectal cancer Neg Hx   . Stomach cancer Neg Hx     Medications- reviewed and updated Current Outpatient Medications  Medication Sig Dispense Refill  . escitalopram (LEXAPRO) 10 MG tablet Take 5-10 mg by mouth daily.    . modafinil (PROVIGIL) 200 MG tablet     . niacin 500 MG tablet Take 500 mg by mouth daily.    Marland Kitchen omeprazole (PRILOSEC) 40 MG capsule TAKE ONE CAPSULE BY MOUTH DAILY. TAKE TWICE DAILY FOR TWO WEEKS, THENONCE DAILY THEREAFTER 90 capsule 3  . vitamin B-12 (CYANOCOBALAMIN) 250 MCG tablet Take 250 mcg by mouth daily.     No current facility-administered medications for this  visit.    Allergies-reviewed and updated Allergies  Allergen Reactions  . Penicillins Other (See Comments)    Kidney and lung failure   Social History   Socioeconomic History  . Marital status: Married    Spouse name: Janett Billow  . Number of children: 2  . Years of education: None  . Highest education level: None  Social Needs  . Financial resource strain: None  . Food insecurity - worry: None  . Food insecurity - inability: None  . Transportation needs - medical: None  . Transportation needs - non-medical: None  Occupational History  . Occupation: IT trainer- [employer]  Tobacco Use  . Smoking status: Never Smoker  . Smokeless tobacco: Never Used  Substance and Sexual Activity  . Alcohol use: Yes    Alcohol/week: 0.6 oz    Types: 1 Cans of beer per week    Comment: occ  . Drug use: No  . Sexual activity: Yes    Partners: Female    Birth control/protection: Surgical  Other Topics Concern  . None  Social History Narrative  . None   Objective:  Physical Exam: BP 124/72 (BP Location: Left Arm, Patient Position: Sitting, Cuff Size: Normal)   Pulse 67   Temp 98.9 F (37.2 C) (Oral)   Ht 5'  10" (1.778 m)   Wt 213 lb 12.8 oz (97 kg)   SpO2 97%   BMI 30.68 kg/m   Body mass index is 30.68 kg/m. Wt Readings from Last 3 Encounters:  10/04/17 213 lb 12.8 oz (97 kg)  08/19/16 210 lb (95.3 kg)  07/27/16 210 lb (95.3 kg)   Gen: NAD, resting comfortably HEENT: TMs normal bilaterally. OP clear. No thyromegaly noted.  CV: RRR with no murmurs appreciated Pulm: NWOB, CTAB with no crackles, wheezes, or rhonchi GI: Normal bowel sounds present. Soft, Nontender, Nondistended. MSK: no edema, cyanosis, or clubbing noted Skin: warm, dry Neuro: CN2-12 grossly intact. Strength 5/5 in upper and lower extremities. Reflexes symmetric and intact bilaterally.  Psych: Normal affect and thought content  Assessment/Plan:  Major depression in remission ([place]) Stable. Continue lexapro.     Anxiety disorder Stable. Continue lexapro.   Preventative Healthcare: Pt will bring most recent lipid panel from work. Check HIV antibody today. Will give tdap.   Patient Counseling:  -Nutrition: Stressed importance of moderation in sodium/caffeine intake, saturated fat and cholesterol, caloric balance, sufficient intake of fresh fruits, vegetables, and fiber.  -Stressed the importance of regular exercise.   -Substance Abuse: Discussed cessation/primary prevention of tobacco, alcohol, or other drug use; driving or other dangerous activities under the influence; availability of treatment for abuse.   -Injury prevention: Discussed safety belts, safety helmets, smoke detector, smoking near bedding or upholstery.   -Sexuality: Discussed sexually transmitted diseases, partner selection, use of condoms, avoidance of unintended pregnancy and contraceptive alternatives.   -Dental health: Discussed importance of regular tooth brushing, flossing, and dental visits.  -Health maintenance and immunizations reviewed. Please refer to Health maintenance section.  Return to care in 1 year for next preventative visit.   Algis Greenhouse. Jerline Pain, MD 10/04/2017 3:41 PM

## 2017-10-07 ENCOUNTER — Encounter: Payer: Self-pay | Admitting: Family Medicine

## 2017-10-25 DIAGNOSIS — M9902 Segmental and somatic dysfunction of thoracic region: Secondary | ICD-10-CM | POA: Diagnosis not present

## 2017-10-25 DIAGNOSIS — M9901 Segmental and somatic dysfunction of cervical region: Secondary | ICD-10-CM | POA: Diagnosis not present

## 2017-10-25 DIAGNOSIS — M542 Cervicalgia: Secondary | ICD-10-CM | POA: Diagnosis not present

## 2018-02-01 ENCOUNTER — Emergency Department (HOSPITAL_COMMUNITY)
Admission: EM | Admit: 2018-02-01 | Discharge: 2018-02-01 | Disposition: A | Discharge status: Home / Self Care | Payer: 59 | Attending: Emergency Medicine | Admitting: Emergency Medicine

## 2018-02-01 ENCOUNTER — Encounter (HOSPITAL_COMMUNITY): Payer: Self-pay | Admitting: Emergency Medicine

## 2018-02-01 DIAGNOSIS — F419 Anxiety disorder, unspecified: Secondary | ICD-10-CM | POA: Diagnosis not present

## 2018-02-01 DIAGNOSIS — N200 Calculus of kidney: Secondary | ICD-10-CM | POA: Diagnosis not present

## 2018-02-01 DIAGNOSIS — F329 Major depressive disorder, single episode, unspecified: Secondary | ICD-10-CM | POA: Diagnosis not present

## 2018-02-01 DIAGNOSIS — R1030 Lower abdominal pain, unspecified: Secondary | ICD-10-CM | POA: Diagnosis present

## 2018-02-01 LAB — BASIC METABOLIC PANEL
Anion gap: 10 (ref 5–15)
BUN: 22 mg/dL — AB (ref 6–20)
CHLORIDE: 107 mmol/L (ref 98–111)
CO2: 24 mmol/L (ref 22–32)
Calcium: 9.3 mg/dL (ref 8.9–10.3)
Creatinine, Ser: 1.21 mg/dL (ref 0.61–1.24)
GFR calc Af Amer: >60 mL/min (ref >60)
GFR calc non Af Amer: >60 mL/min (ref >60)
Glucose, Bld: 107 mg/dL — ABNORMAL HIGH (ref 70–99)
POTASSIUM: 3.9 mmol/L (ref 3.5–5.1)
SODIUM: 141 mmol/L (ref 135–145)

## 2018-02-01 LAB — CBC WITH DIFFERENTIAL/PLATELET
Basophils Absolute: 0.1 10*3/uL (ref 0.0–0.1)
Basophils Relative: 1 %
Eosinophils Absolute: 0.2 10*3/uL (ref 0.0–0.7)
Eosinophils Relative: 2 %
HCT: 43.1 % (ref 39.0–52.0)
HEMOGLOBIN: 14.6 g/dL (ref 13.0–17.0)
LYMPHS ABS: 1.3 10*3/uL (ref 0.7–4.0)
LYMPHS PCT: 9 %
MCH: 31.8 pg (ref 26.0–34.0)
MCHC: 33.9 g/dL (ref 30.0–36.0)
MCV: 93.9 fL (ref 78.0–100.0)
Monocytes Absolute: 1 10*3/uL (ref 0.1–1.0)
Monocytes Relative: 7 %
NEUTROS PCT: 83 %
Neutro Abs: 12.5 10*3/uL — ABNORMAL HIGH (ref 1.7–7.7)
Platelets: 373 10*3/uL (ref 150–400)
RBC: 4.59 MIL/uL (ref 4.22–5.81)
RDW: 12.7 % (ref 11.5–15.5)
WBC: 15.1 10*3/uL — AB (ref 4.0–10.5)

## 2018-02-01 LAB — URINALYSIS, ROUTINE W REFLEX MICROSCOPIC
BACTERIA UA: NONE SEEN
BILIRUBIN URINE: NEGATIVE
Glucose, UA: NEGATIVE mg/dL
Ketones, ur: 20 mg/dL — AB
LEUKOCYTES UA: NEGATIVE
NITRITE: NEGATIVE
PROTEIN: 30 mg/dL — AB
RBC / HPF: >50 RBC/hpf — ABNORMAL HIGH (ref 0–5)
Specific Gravity, Urine: 1.024 (ref 1.005–1.030)
pH: 5 (ref 5.0–8.0)

## 2018-02-01 MED ORDER — TAMSULOSIN HCL 0.4 MG PO CAPS: 0.4000 mg | ORAL_CAPSULE | Freq: Every day | ORAL | 0 refills | Status: DC

## 2018-02-01 MED ORDER — HYDROMORPHONE HCL 1 MG/ML IJ SOLN
1.0000 mg | Freq: Once | INTRAMUSCULAR | Status: AC
  Administered 2018-02-01: 1 mg via INTRAVENOUS
  Filled 2018-02-01: qty 1

## 2018-02-01 MED ORDER — KETOROLAC TROMETHAMINE 30 MG/ML IJ SOLN
30.0000 mg | Freq: Once | INTRAMUSCULAR | Status: AC
  Administered 2018-02-01: 30 mg via INTRAVENOUS
  Filled 2018-02-01: qty 1

## 2018-02-01 MED ORDER — SODIUM CHLORIDE 0.9 % IV BOLUS
1000.0000 mL | Freq: Once | INTRAVENOUS | Status: AC
  Administered 2018-02-01: 1000 mL via INTRAVENOUS

## 2018-02-01 MED ORDER — IBUPROFEN 400 MG PO TABS: 400.0000 mg | ORAL_TABLET | Freq: Four times a day (QID) | ORAL | 0 refills | Status: DC | PRN

## 2018-02-01 MED ORDER — ONDANSETRON HCL 4 MG PO TABS: 4.0000 mg | ORAL_TABLET | Freq: Three times a day (TID) | ORAL | 0 refills | Status: DC | PRN

## 2018-02-01 MED ORDER — ONDANSETRON HCL 4 MG/2ML IJ SOLN
4.0000 mg | Freq: Once | INTRAMUSCULAR | Status: AC
  Administered 2018-02-01: 4 mg via INTRAVENOUS
  Filled 2018-02-01: qty 2

## 2018-02-01 MED ORDER — OXYCODONE-ACETAMINOPHEN 5-325 MG PO TABS: 1.0000 | ORAL_TABLET | ORAL | 0 refills | Status: DC | PRN

## 2018-02-01 NOTE — ED Provider Notes (Signed)
Emergency Department Provider Note   I have reviewed the triage vital signs and the nursing notes.   HISTORY  Chief Complaint Flank Pain   HPI [name] is a 42 y.o. male with history of ureterolithiasis who presents the emergency department today secondary to kidney stone pain.  Patient states he had kidney stones in the past and felt similar to this but this pain is worse.  Started a while ago but just today got significantly worsened.  On the right side.  Decreased urine output with it as well.  Did not try anything at home besides Flomax that was 42 years old, and it no fevers.  No cough or respiratory symptoms.  No other GI symptoms besides nausea.  Did not seem to help. No other associated or modifying symptoms.    Past Medical History:  Diagnosis Date  . Kidney stone   . Renal disorder     Patient Active Problem List   Diagnosis Date Noted  . Major depression in remission ([place]) 10/04/2017  . Anxiety disorder 10/04/2017    Past Surgical History:  Procedure Laterality Date  . EYE SURGERY Bilateral 1999   Lasik  . LUNG SURGERY     from reaction to penicillin  . VASECTOMY      Current Outpatient Rx  . Order #: [id number] Class: Historical Med  . Order #: [id number] Class: Historical Med  . Order #: [id number] Class: Historical Med  . Order #: [id number] Class: Historical Med  . Order #: [id number] Class: Print  . Order #: [id number] Class: Print  . Order #: [id number] Class: Print  . Order #: [id number] Class: Print    Allergies Penicillins  Family History  Problem Relation Age of Onset  . Muscular dystrophy Mother   . Arthritis Mother   . Early death Mother   . Stroke Mother   . Diabetes Father   . Heart disease Father   . Colon cancer Neg Hx   . Colon polyps Neg Hx   . Esophageal cancer Neg Hx   . Rectal cancer Neg Hx   . Stomach cancer Neg Hx     Social History Social History   Tobacco Use  . Smoking status: Never Smoker  . Smokeless tobacco: Never  Used  Substance Use Topics  . Alcohol use: Yes    Alcohol/week: 0.6 oz    Types: 1 Cans of beer per week    Comment: occ  . Drug use: No    Review of Systems  All other systems negative except as documented in the HPI. All pertinent positives and negatives as reviewed in the HPI. ____________________________________________   PHYSICAL EXAM:  VITAL SIGNS: ED Triage Vitals  Enc Vitals Group     BP 02/01/18 1702 (!) 153/101     Pulse Rate 02/01/18 1702 67     Resp 02/01/18 1702 16     Temp 02/01/18 1702 98.2 F (36.8 C)     Temp Source 02/01/18 1702 Oral     SpO2 02/01/18 1702 100 %     Weight 02/01/18 1703 200 lb (90.7 kg)     Height 02/01/18 1703 5\' 10"  (1.778 m)    Constitutional: Alert and oriented. Well appearing and in no acute distress likely secondary to obvious pain. Eyes: Conjunctivae are normal. PERRL. EOMI. Head: Atraumatic. Nose: No congestion/rhinnorhea. Mouth/Throat: Mucous membranes are moist.  Oropharynx non-erythematous. Neck: No stridor.  No meningeal signs.   Cardiovascular: Normal rate, regular rhythm. Good peripheral circulation. Grossly normal heart sounds.   Respiratory: Normal  respiratory effort.  No retractions. Lungs CTAB. Gastrointestinal: Soft and nontender. No distention.  Musculoskeletal: No lower extremity tenderness nor edema. No gross deformities of extremities. Neurologic:  Normal speech and language. No gross focal neurologic deficits are appreciated.  Skin:  Skin is warm, dry and intact. No rash noted.   ____________________________________________   LABS (all labs ordered are listed, but only abnormal results are displayed)  Labs Reviewed  URINALYSIS, ROUTINE W REFLEX MICROSCOPIC - Abnormal; Notable for the following components:      Result Value   APPearance HAZY (*)    Hgb urine dipstick MODERATE (*)    Ketones, ur 20 (*)    Protein, ur 30 (*)    RBC / HPF >50 (*)    All other components within normal limits  CBC WITH  DIFFERENTIAL/PLATELET - Abnormal; Notable for the following components:   WBC 15.1 (*)    Neutro Abs 12.5 (*)    All other components within normal limits  BASIC METABOLIC PANEL - Abnormal; Notable for the following components:   Glucose, Bld 107 (*)    BUN 22 (*)    All other components within normal limits   ____________________________________________   INITIAL IMPRESSION / ASSESSMENT AND PLAN / ED COURSE  Suspect kidney stone. Will treat/eval for same.   reeval with significant improvement in symptoms. Laying in bed messing with phone, appears much improved.   reeval still with improvement. No e/o obstructive uropathy, kidney infection or other complicating causes. Stable for dc w/ pain meds, anti-emetics, nsaids and flomax.  Pertinent labs & imaging results that were available during my care of the patient were reviewed by me and considered in my medical decision making (see chart for details).  ____________________________________________  FINAL CLINICAL IMPRESSION(S) / ED DIAGNOSES  Final diagnoses:  Kidney stone     MEDICATIONS GIVEN DURING THIS VISIT:  Medications  HYDROmorphone (DILAUDID) injection 1 mg (1 mg Intravenous Given 02/01/18 1751)  sodium chloride 0.9 % bolus 1,000 mL (0 mLs Intravenous Stopped 02/01/18 1859)  ondansetron (ZOFRAN) injection 4 mg (4 mg Intravenous Given 02/01/18 1751)  sodium chloride 0.9 % bolus 1,000 mL (0 mLs Intravenous Stopped 02/01/18 2008)  ketorolac (TORADOL) 30 MG/ML injection 30 mg (30 mg Intravenous Given 02/01/18 2047)     NEW OUTPATIENT MEDICATIONS STARTED DURING THIS VISIT:  Discharge Medication List as of 02/01/2018  8:41 PM    START taking these medications   Details  ibuprofen (ADVIL,MOTRIN) 400 MG tablet Take 1 tablet (400 mg total) by mouth every 6 (six) hours as needed., Starting Tue 02/01/2018, Print    ondansetron (ZOFRAN) 4 MG tablet Take 1 tablet (4 mg total) by mouth every 8 (eight) hours as needed for nausea or  vomiting., Starting Tue 02/01/2018, Print    oxyCODONE-acetaminophen (PERCOCET) 5-325 MG tablet Take 1-2 tablets by mouth every 4 (four) hours as needed., Starting Tue 02/01/2018, Print    tamsulosin (FLOMAX) 0.4 MG CAPS capsule Take 1 capsule (0.4 mg total) by mouth daily., Starting Tue 02/01/2018, Print        Note:  This note was prepared with assistance of Dragon voice recognition software. Occasional wrong-word or sound-a-like substitutions may have occurred due to the inherent limitations of voice recognition software.   Merrily Pew, MD 02/01/18 2114

## 2018-02-01 NOTE — ED Triage Notes (Signed)
Patient complains of right flank pain that started this afternoon. States unable to urinate. Also, nausea, vomiting. States history of kidney stones.

## 2018-02-01 NOTE — ED Notes (Signed)
ED Provider at bedside. 

## 2018-02-01 NOTE — ED Notes (Signed)
Bladder scan.   Pt has 25ml urine, according to scan.

## 2018-03-08 DIAGNOSIS — Z87442 Personal history of urinary calculi: Secondary | ICD-10-CM | POA: Diagnosis not present

## 2018-03-08 DIAGNOSIS — J302 Other seasonal allergic rhinitis: Secondary | ICD-10-CM | POA: Diagnosis not present

## 2018-03-21 DIAGNOSIS — M9903 Segmental and somatic dysfunction of lumbar region: Secondary | ICD-10-CM | POA: Diagnosis not present

## 2018-03-21 DIAGNOSIS — M5441 Lumbago with sciatica, right side: Secondary | ICD-10-CM | POA: Diagnosis not present

## 2018-03-21 DIAGNOSIS — M9902 Segmental and somatic dysfunction of thoracic region: Secondary | ICD-10-CM | POA: Diagnosis not present

## 2018-03-23 DIAGNOSIS — J302 Other seasonal allergic rhinitis: Secondary | ICD-10-CM | POA: Diagnosis not present

## 2018-03-23 DIAGNOSIS — Z87442 Personal history of urinary calculi: Secondary | ICD-10-CM | POA: Diagnosis not present

## 2018-03-23 DIAGNOSIS — Z0000 Encounter for general adult medical examination without abnormal findings: Secondary | ICD-10-CM | POA: Diagnosis not present

## 2018-03-24 DIAGNOSIS — M9902 Segmental and somatic dysfunction of thoracic region: Secondary | ICD-10-CM | POA: Diagnosis not present

## 2018-03-24 DIAGNOSIS — M5441 Lumbago with sciatica, right side: Secondary | ICD-10-CM | POA: Diagnosis not present

## 2018-03-24 DIAGNOSIS — M9903 Segmental and somatic dysfunction of lumbar region: Secondary | ICD-10-CM | POA: Diagnosis not present

## 2018-03-29 DIAGNOSIS — Z0000 Encounter for general adult medical examination without abnormal findings: Secondary | ICD-10-CM | POA: Diagnosis not present

## 2018-03-29 DIAGNOSIS — J302 Other seasonal allergic rhinitis: Secondary | ICD-10-CM | POA: Diagnosis not present

## 2018-10-05 DIAGNOSIS — R51 Headache: Secondary | ICD-10-CM | POA: Diagnosis not present

## 2018-10-05 DIAGNOSIS — R509 Fever, unspecified: Secondary | ICD-10-CM | POA: Diagnosis not present

## 2018-10-05 DIAGNOSIS — R05 Cough: Secondary | ICD-10-CM | POA: Diagnosis not present

## 2020-02-17 ENCOUNTER — Encounter (HOSPITAL_COMMUNITY): Payer: Self-pay | Admitting: Emergency Medicine

## 2020-02-17 ENCOUNTER — Other Ambulatory Visit: Payer: Self-pay

## 2020-02-17 ENCOUNTER — Emergency Department (HOSPITAL_COMMUNITY): Payer: 59

## 2020-02-17 ENCOUNTER — Emergency Department (HOSPITAL_COMMUNITY)
Admission: EM | Admit: 2020-02-17 | Discharge: 2020-02-17 | Disposition: A | Discharge status: Home / Self Care | Payer: 59 | Attending: Emergency Medicine | Admitting: Emergency Medicine

## 2020-02-17 DIAGNOSIS — N201 Calculus of ureter: Secondary | ICD-10-CM

## 2020-02-17 DIAGNOSIS — R109 Unspecified abdominal pain: Secondary | ICD-10-CM | POA: Diagnosis present

## 2020-02-17 LAB — URINALYSIS, ROUTINE W REFLEX MICROSCOPIC
Bacteria, UA: NONE SEEN
Bilirubin Urine: NEGATIVE
Glucose, UA: NEGATIVE mg/dL
Ketones, ur: NEGATIVE mg/dL
Leukocytes,Ua: NEGATIVE
Nitrite: NEGATIVE
Protein, ur: NEGATIVE mg/dL
Specific Gravity, Urine: 1.016 (ref 1.005–1.030)
pH: 5 (ref 5.0–8.0)

## 2020-02-17 LAB — BASIC METABOLIC PANEL
Anion gap: 11 (ref 5–15)
BUN: 18 mg/dL (ref 6–20)
CO2: 23 mmol/L (ref 22–32)
Calcium: 9.2 mg/dL (ref 8.9–10.3)
Chloride: 104 mmol/L (ref 98–111)
Creatinine, Ser: 1.05 mg/dL (ref 0.61–1.24)
GFR calc Af Amer: >60 mL/min (ref >60)
GFR calc non Af Amer: >60 mL/min (ref >60)
Glucose, Bld: 108 mg/dL — ABNORMAL HIGH (ref 70–99)
Potassium: 3.8 mmol/L (ref 3.5–5.1)
Sodium: 138 mmol/L (ref 135–145)

## 2020-02-17 LAB — CBC
HCT: 48 % (ref 39.0–52.0)
Hemoglobin: 15.9 g/dL (ref 13.0–17.0)
MCH: 31.7 pg (ref 26.0–34.0)
MCHC: 33.1 g/dL (ref 30.0–36.0)
MCV: 95.6 fL (ref 80.0–100.0)
Platelets: 387 10*3/uL (ref 150–400)
RBC: 5.02 MIL/uL (ref 4.22–5.81)
RDW: 12.4 % (ref 11.5–15.5)
WBC: 8.6 10*3/uL (ref 4.0–10.5)
nRBC: 0 % (ref 0.0–0.2)

## 2020-02-17 MED ORDER — OXYCODONE-ACETAMINOPHEN 5-325 MG PO TABS: 1.0000 | ORAL_TABLET | ORAL | 0 refills | Status: DC | PRN

## 2020-02-17 MED ORDER — TAMSULOSIN HCL 0.4 MG PO CAPS: 0.4000 mg | ORAL_CAPSULE | Freq: Every day | ORAL | 0 refills | Status: DC

## 2020-02-17 MED ORDER — ONDANSETRON HCL 4 MG PO TABS: 4.0000 mg | ORAL_TABLET | Freq: Three times a day (TID) | ORAL | 0 refills | Status: DC | PRN

## 2020-02-17 NOTE — ED Triage Notes (Signed)
Pt reports right sided flank and right testicular pain starting last night, hx of kidney stones, took 400mg  ibuprofen, 1 5-325 hydrocodone and 0.4mg  tamsulosin with no relief

## 2020-02-17 NOTE — Discharge Instructions (Addendum)
You were seen in the emergency department for right flank and right groin/testicle pain.  Your CAT scan showed a 1 mm kidney stone that is close to passing into the bladder.  There were other incidental findings that will need follow-up.  We are prescribing you some pain and nausea medication along with some medication to dilate the tube to make the stone pass easier.  Please contact your urologist for close follow-up.  Return to the emergency department if fever or uncontrolled pain.

## 2020-02-17 NOTE — ED Provider Notes (Signed)
[facility] EMERGENCY DEPARTMENT Provider Note   CSN: [id number] Arrival date & time: 02/17/20  0865     History Chief Complaint  Patient presents with  . Flank Pain    [name] is a 44 y.o. male.  He has a prior history of kidney stones.  He started with right flank pain into right testicle starting last night.  He said he thought the testicle was swollen.  Associated nausea and dry heaves.  Rates the pain as 7 out of 10 currently 3 out of 10.  Took ibuprofen hydrocodone and Flomax.  Has not seen any hematuria.  No fevers.  No urinary symptoms.  The history is provided by the patient.  Flank Pain This is a recurrent problem. The current episode started yesterday. The problem occurs constantly. The problem has been gradually improving. Associated symptoms include abdominal pain. Pertinent negatives include no chest pain, no headaches and no shortness of breath. Nothing aggravates the symptoms. Nothing relieves the symptoms. He has tried rest for the symptoms. The treatment provided mild relief.       Past Medical History:  Diagnosis Date  . Kidney stone   . Renal disorder     Patient Active Problem List   Diagnosis Date Noted  . 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 Anxiety disorder 10/04/2017    Past Surgical History:  Procedure Laterality Date  . EYE SURGERY Bilateral 1999   Lasik  . LUNG SURGERY     from reaction to penicillin  . VASECTOMY         Family History  Problem Relation Age of Onset  . Muscular dystrophy Mother   . Arthritis Mother   . Early death Mother   . Stroke Mother   . Diabetes Father   . Heart disease Father   . Colon cancer Neg Hx   . Colon polyps Neg Hx   . Esophageal cancer Neg Hx   . Rectal cancer Neg Hx   . Stomach cancer Neg Hx     Social History   Tobacco Use  . Smoking status: Never Smoker  . Smokeless tobacco: Never Used  Substance Use Topics  . Alcohol use: Yes    Alcohol/week: 1.0 standard drink     Types: 1 Cans of beer per week    Comment: occ  . Drug use: No    Home Medications Prior to Admission medications   Medication Sig Start Date End Date Taking? Authorizing Provider  atorvastatin (LIPITOR) 40 MG tablet Take 1 tablet by mouth daily. 02/07/20  Yes [provider]  cetirizine (ZYRTEC) 10 MG tablet Take 10 mg by mouth daily.   Yes [provider]  ibuprofen (ADVIL,MOTRIN) 400 MG tablet Take 1 tablet (400 mg total) by mouth every 6 (six) hours as needed. 02/01/18  Yes Mesner, Corene Cornea, MD  L-Tyrosine 1000 MG TABS Take 1 capsule by mouth daily.   Yes [provider]  Lavender Oil 80 MG CAPS Take 1 capsule by mouth daily.   Yes [provider]  Multiple Vitamins-Minerals (ONE-A-DAY MENS HEALTH FORMULA PO) Take 1 capsule by mouth daily.   Yes [provider]  ondansetron (ZOFRAN) 4 MG tablet Take 1 tablet (4 mg total) by mouth every 8 (eight) hours as needed for nausea or vomiting. 02/01/18  Yes Mesner, Corene Cornea, MD  oxyCODONE-acetaminophen (PERCOCET) 5-325 MG tablet Take 1-2 tablets by mouth every 4 (four) hours as needed. 02/01/18  Yes Mesner, Corene Cornea, MD  sertraline (ZOLOFT) 25 MG  tablet Take 25 mg by mouth daily.   Yes [provider]  sertraline (ZOLOFT) 50 MG tablet Take 50 mg by mouth daily.   Yes [provider]  tamsulosin (FLOMAX) 0.4 MG CAPS capsule Take 1 capsule (0.4 mg total) by mouth daily. 02/01/18  Yes Mesner, Corene Cornea, MD    Allergies    Penicillins  Review of Systems   Review of Systems  Constitutional: Negative for fever.  HENT: Negative for sore throat.   Eyes: Negative for visual disturbance.  Respiratory: Negative for shortness of breath.   Cardiovascular: Negative for chest pain.  Gastrointestinal: Positive for abdominal pain, nausea and vomiting.  Genitourinary: Positive for flank pain and testicular pain. Negative for dysuria.  Musculoskeletal: Positive for back pain.  Skin: Negative for rash.    Neurological: Negative for headaches.    Physical Exam Updated Vital Signs BP 136/85 (BP Location: Left Arm)   Pulse 65   Temp 98.4 F (36.9 C) (Oral)   Resp 18   Ht 5\' 9"  (1.753 m)   Wt 93 kg   SpO2 100%   BMI 30.27 kg/m   Physical Exam Vitals and nursing note reviewed.  Constitutional:      Appearance: He is well-developed.  HENT:     Head: Normocephalic and atraumatic.  Eyes:     Conjunctiva/sclera: Conjunctivae normal.  Cardiovascular:     Rate and Rhythm: Normal rate and regular rhythm.     Heart sounds: No murmur heard.   Pulmonary:     Effort: Pulmonary effort is normal. No respiratory distress.     Breath sounds: Normal breath sounds.  Abdominal:     Palpations: Abdomen is soft.     Tenderness: There is no abdominal tenderness. There is no right CVA tenderness or left CVA tenderness.  Genitourinary:    Penis: Normal.      Testes: Normal.  Musculoskeletal:        General: No deformity or signs of injury. Normal range of motion.     Cervical back: Neck supple.  Skin:    General: Skin is warm and dry.     Capillary Refill: Capillary refill takes less than 2 seconds.  Neurological:     General: No focal deficit present.     Mental Status: He is alert.     Gait: Gait normal.     ED Results / Procedures / Treatments   Labs (all labs ordered are listed, but only abnormal results are displayed) Labs Reviewed  URINALYSIS, ROUTINE W REFLEX MICROSCOPIC - Abnormal; Notable for the following components:      Result Value   Hgb urine dipstick SMALL (*)    All other components within normal limits  BASIC METABOLIC PANEL - Abnormal; Notable for the following components:   Glucose, Bld 108 (*)    All other components within normal limits  URINE CULTURE  CBC    EKG None  Radiology CT Renal Stone Study  Result Date: 02/17/2020 CLINICAL DATA:  RIGHT-sided flank pain with nausea starting this morning. EXAM: CT ABDOMEN AND PELVIS WITHOUT CONTRAST TECHNIQUE:  Multidetector CT imaging of the abdomen and pelvis was performed following the standard protocol without IV contrast. COMPARISON:  CT abdomen dated 06/04/2012. FINDINGS: Lower chest: No acute abnormality. Hepatobiliary: No focal liver abnormality is seen. No gallstones, gallbladder wall thickening, or biliary dilatation. Pancreas: Unremarkable. No pancreatic ductal dilatation or surrounding inflammatory changes. Spleen: Normal in size without focal abnormality. Adrenals/Urinary Tract: Adrenal glands appear normal. Moderate RIGHT-sided hydronephrosis. 2  mm stone at the internal margin of the RIGHT UVJ, possibly already passed into the bladder. At least 4 additional punctate RIGHT renal stones. 2 mm and 3 mm nonobstructing LEFT renal stones. Subcentimeter hypodense lesion within the posterior cortex of the RIGHT kidney, too small to characterize on this noncontrast study, not present on the previous study. Stomach/Bowel: No dilated large or small bowel loops. No evidence of bowel wall inflammation. Appendix is normal. Stomach is unremarkable, partially decompressed. Vascular/Lymphatic: No significant vascular findings are present. No enlarged abdominal or pelvic lymph nodes. Reproductive: Prostate is unremarkable. Other: No free fluid or abscess collection. No free intraperitoneal air. Musculoskeletal: No acute or suspicious osseous finding. Small bilateral inguinal hernias, RIGHT greater than LEFT, containing fat only. IMPRESSION: 1. 2 mm stone at the internal margin of the RIGHT UVJ, possibly already passed into the bladder, with associated moderate RIGHT-sided hydronephrosis. 2. Bilateral nephrolithiasis. 3. Small bilateral inguinal hernias, RIGHT greater than LEFT, containing fat only. 4. Subcentimeter nonspecific hypodense lesion within the posterior cortex of the RIGHT kidney, statistically most likely to represent a benign renal cyst but too small to characterize on this noncontrast study. As it was not present  on the previous study, recommend further characterization with nonemergent outpatient renal MRI to ensure benignity. Electronically Signed   By: Franki Cabot M.D.   On: 02/17/2020 10:35    Procedures Procedures (including critical care time)  Medications Ordered in ED Medications - No data to display  ED Course  I have reviewed the triage vital signs and the nursing notes.  Pertinent labs & imaging results that were available during my care of the patient were reviewed by me and considered in my medical decision making (see chart for details).    MDM Rules/Calculators/A&P                          This patient complains of right flank pain into the right testicle with possibly some right testicular swelling; this involves an extensive number of treatment Options and is a complaint that carries with it a high risk of complications and Morbidity. The differential includes renal colic, testicular torsion, inguinal hernia, pyelonephritis  I ordered, reviewed and interpreted labs, which included CBC normal, chemistries normal other than elevated glucose, normal renal function, urinalysis without signs of infection I ordered medication none I ordered imaging studies which included CT renal stone study and I independently    visualized and interpreted imaging which showed 1 mm stone at the UVJ, other incidental findings Previous records obtained and reviewed in epic, no significant recent visits  After the interventions stated above, I reevaluated the patient and found patient's pain well controlled. I reviewed his work-up with him and the results of the CT. Have sent prescriptions for pain and nausea medication along with Flomax. Return instructions discussed.  *Final Clinical Impression(s) / ED Diagnoses Final diagnoses:  Ureterolithiasis    Rx / DC Orders ED Discharge Orders         Ordered    oxyCODONE-acetaminophen (PERCOCET) 5-325 MG tablet  Every 4 hours PRN,   Status:   Discontinued     Reprint     02/17/20 1050    tamsulosin (FLOMAX) 0.4 MG CAPS capsule  Daily     Discontinue  Reprint     02/17/20 1050    ondansetron (ZOFRAN) 4 MG tablet  Every 8 hours PRN     Discontinue  Reprint     02/17/20 1050  oxyCODONE-acetaminophen (PERCOCET) 5-325 MG tablet  Every 4 hours PRN     Discontinue  Reprint     02/17/20 1051           Hayden Rasmussen, MD 02/17/20 2023

## 2020-02-18 LAB — URINE CULTURE: Culture: NO GROWTH

## 2020-03-07 ENCOUNTER — Other Ambulatory Visit (HOSPITAL_COMMUNITY): Payer: Self-pay | Admitting: Internal Medicine

## 2020-03-07 ENCOUNTER — Other Ambulatory Visit: Payer: Self-pay | Admitting: Internal Medicine

## 2020-03-07 DIAGNOSIS — D4111 Neoplasm of uncertain behavior of right renal pelvis: Secondary | ICD-10-CM

## 2020-03-27 ENCOUNTER — Ambulatory Visit (HOSPITAL_COMMUNITY): Payer: 59

## 2020-03-27 ENCOUNTER — Encounter (HOSPITAL_COMMUNITY): Payer: Self-pay

## 2020-04-12 ENCOUNTER — Ambulatory Visit (HOSPITAL_COMMUNITY): Payer: 59

## 2020-04-23 ENCOUNTER — Other Ambulatory Visit (HOSPITAL_COMMUNITY): Payer: Self-pay | Admitting: Internal Medicine

## 2020-04-23 DIAGNOSIS — N281 Cyst of kidney, acquired: Secondary | ICD-10-CM

## 2020-04-24 ENCOUNTER — Other Ambulatory Visit: Payer: Self-pay

## 2020-04-24 ENCOUNTER — Ambulatory Visit (HOSPITAL_COMMUNITY)
Admission: RE | Admit: 2020-04-24 | Discharge: 2020-04-24 | Disposition: A | Discharge status: Home / Self Care | Payer: 59 | Source: Ambulatory Visit | Attending: Internal Medicine | Admitting: Internal Medicine

## 2020-04-24 DIAGNOSIS — N281 Cyst of kidney, acquired: Secondary | ICD-10-CM | POA: Diagnosis not present

## 2020-04-24 MED ORDER — GADOBUTROL 1 MMOL/ML IV SOLN
10.0000 mL | Freq: Once | INTRAVENOUS | Status: AC | PRN
  Administered 2020-04-24: 10 mL via INTRAVENOUS

## 2020-05-07 ENCOUNTER — Ambulatory Visit (HOSPITAL_COMMUNITY): Payer: 59

## 2020-10-28 ENCOUNTER — Other Ambulatory Visit: Payer: Self-pay

## 2020-10-28 ENCOUNTER — Emergency Department (HOSPITAL_COMMUNITY)
Admission: EM | Admit: 2020-10-28 | Discharge: 2020-10-28 | Disposition: A | Discharge status: Home / Self Care | Payer: 59 | Attending: Emergency Medicine | Admitting: Emergency Medicine

## 2020-10-28 ENCOUNTER — Emergency Department (HOSPITAL_COMMUNITY): Payer: 59

## 2020-10-28 ENCOUNTER — Encounter (HOSPITAL_COMMUNITY): Payer: Self-pay

## 2020-10-28 DIAGNOSIS — N202 Calculus of kidney with calculus of ureter: Secondary | ICD-10-CM | POA: Diagnosis not present

## 2020-10-28 DIAGNOSIS — R109 Unspecified abdominal pain: Secondary | ICD-10-CM | POA: Diagnosis present

## 2020-10-28 DIAGNOSIS — N201 Calculus of ureter: Secondary | ICD-10-CM

## 2020-10-28 LAB — CBC WITH DIFFERENTIAL/PLATELET
Abs Immature Granulocytes: 0.03 10*3/uL (ref 0.00–0.07)
Basophils Absolute: 0.1 10*3/uL (ref 0.0–0.1)
Basophils Relative: 1 %
Eosinophils Absolute: 0.1 10*3/uL (ref 0.0–0.5)
Eosinophils Relative: 1 %
HCT: 45.4 % (ref 39.0–52.0)
Hemoglobin: 15.1 g/dL (ref 13.0–17.0)
Immature Granulocytes: 0 %
Lymphocytes Relative: 6 %
Lymphs Abs: 0.6 10*3/uL — ABNORMAL LOW (ref 0.7–4.0)
MCH: 31.7 pg (ref 26.0–34.0)
MCHC: 33.3 g/dL (ref 30.0–36.0)
MCV: 95.4 fL (ref 80.0–100.0)
Monocytes Absolute: 0.2 10*3/uL (ref 0.1–1.0)
Monocytes Relative: 2 %
Neutro Abs: 9.7 10*3/uL — ABNORMAL HIGH (ref 1.7–7.7)
Neutrophils Relative %: 90 %
Platelets: 367 10*3/uL (ref 150–400)
RBC: 4.76 MIL/uL (ref 4.22–5.81)
RDW: 12.3 % (ref 11.5–15.5)
WBC: 10.7 10*3/uL — ABNORMAL HIGH (ref 4.0–10.5)
nRBC: 0 % (ref 0.0–0.2)

## 2020-10-28 LAB — URINALYSIS, ROUTINE W REFLEX MICROSCOPIC
Bacteria, UA: NONE SEEN
Bilirubin Urine: NEGATIVE
Glucose, UA: NEGATIVE mg/dL
Ketones, ur: NEGATIVE mg/dL
Leukocytes,Ua: NEGATIVE
Nitrite: NEGATIVE
Protein, ur: NEGATIVE mg/dL
Specific Gravity, Urine: 1.026 (ref 1.005–1.030)
pH: 5 (ref 5.0–8.0)

## 2020-10-28 LAB — BASIC METABOLIC PANEL
Anion gap: 9 (ref 5–15)
BUN: 20 mg/dL (ref 6–20)
CO2: 24 mmol/L (ref 22–32)
Calcium: 8.9 mg/dL (ref 8.9–10.3)
Chloride: 103 mmol/L (ref 98–111)
Creatinine, Ser: 1.24 mg/dL (ref 0.61–1.24)
GFR, Estimated: >60 mL/min (ref >60)
Glucose, Bld: 125 mg/dL — ABNORMAL HIGH (ref 70–99)
Potassium: 4.2 mmol/L (ref 3.5–5.1)
Sodium: 136 mmol/L (ref 135–145)

## 2020-10-28 MED ORDER — OXYCODONE-ACETAMINOPHEN 5-325 MG PO TABS: 1.0000 | ORAL_TABLET | ORAL | 0 refills | Status: DC | PRN

## 2020-10-28 MED ORDER — HYDROMORPHONE HCL 1 MG/ML IJ SOLN
1.0000 mg | Freq: Once | INTRAMUSCULAR | Status: AC
  Administered 2020-10-28: 1 mg via INTRAVENOUS
  Filled 2020-10-28: qty 1

## 2020-10-28 MED ORDER — FLUCONAZOLE 150 MG PO TABS
150.0000 mg | ORAL_TABLET | Freq: Once | ORAL | Status: AC
  Administered 2020-10-28: 150 mg via ORAL
  Filled 2020-10-28: qty 1

## 2020-10-28 MED ORDER — SODIUM CHLORIDE 0.9 % IV BOLUS
1000.0000 mL | Freq: Once | INTRAVENOUS | Status: AC
  Administered 2020-10-28: 1000 mL via INTRAVENOUS

## 2020-10-28 MED ORDER — ONDANSETRON 4 MG PO TBDP: 4.0000 mg | ORAL_TABLET | Freq: Three times a day (TID) | ORAL | 0 refills | Status: DC | PRN

## 2020-10-28 MED ORDER — ONDANSETRON HCL 4 MG/2ML IJ SOLN
4.0000 mg | Freq: Once | INTRAMUSCULAR | Status: AC
  Administered 2020-10-28: 4 mg via INTRAVENOUS
  Filled 2020-10-28: qty 2

## 2020-10-28 MED ORDER — KETOROLAC TROMETHAMINE 30 MG/ML IJ SOLN
15.0000 mg | Freq: Once | INTRAMUSCULAR | Status: AC
  Administered 2020-10-28: 15 mg via INTRAVENOUS
  Filled 2020-10-28: qty 1

## 2020-10-28 NOTE — ED Triage Notes (Signed)
Pt reports left flank pain since around 4am this morning.  Reports nausea but no vomiting.  Pt has history of kidney stones and has oxycodone prn.  Pt took tamulosin at 4am, zofran at 6am and oxycodone at 4, 6 and 8 am.  PA at bedside and is aware.

## 2020-10-28 NOTE — ED Provider Notes (Signed)
[name] Provider Note   CSN: [id number] Arrival date & time: 10/28/20  8185     History No chief complaint on file.   [name] is a 45 y.o. male with history significant for kidney stones presenting with sudden onset of left-sided flank pain which woke him around 4 AM this morning.  He has tamsulosin, Zofran and oxycodone which he took this morning, these medications are leftover from his last kidney stone bout which he was seen here last summer.  He denies fevers or chills, denies emesis but did have nausea, denies dysuria but reports small amounts of brighter than normal urine production since his symptoms began.  He has passed stones since his visit last summer, but this pain has not responded to the oxycodone hence his presentation here.  Review of chart indicates CT imaging February 17, 2020 revealing for a 2 mm right ureteral stone, multiple punctate right-sided renal stones and a 3 mm left renal stone.  HPI     Past Medical History:  Diagnosis Date  . Kidney stone   . Renal disorder     Patient Active Problem List   Diagnosis Date Noted  . 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 Anxiety disorder 10/04/2017    Past Surgical History:  Procedure Laterality Date  . EYE SURGERY Bilateral 1999   Lasik  . LUNG SURGERY     from reaction to penicillin  . VASECTOMY         Family History  Problem Relation Age of Onset  . Muscular dystrophy Mother   . Arthritis Mother   . Early death Mother   . Stroke Mother   . Diabetes Father   . Heart disease Father   . Colon cancer Neg Hx   . Colon polyps Neg Hx   . Esophageal cancer Neg Hx   . Rectal cancer Neg Hx   . Stomach cancer Neg Hx     Social History   Tobacco Use  . Smoking status: Never Smoker  . Smokeless tobacco: Never Used  Substance Use Topics  . Alcohol use: Yes    Alcohol/week: 1.0 standard drink    Types: 1 Cans of beer per week    Comment: occ  . Drug use: No     Home Medications Prior to Admission medications   Medication Sig Start Date End Date Taking? Authorizing Provider  atorvastatin (LIPITOR) 40 MG tablet Take 1 tablet by mouth daily. 02/07/20  Yes [provider]  cetirizine (ZYRTEC) 10 MG tablet Take 10 mg by mouth daily.   Yes [provider]  doxycycline (ADOXA) 100 MG tablet Take 100 mg by mouth daily. 10/23/20  Yes [provider]  ibuprofen (ADVIL,MOTRIN) 400 MG tablet Take 1 tablet (400 mg total) by mouth every 6 (six) hours as needed. Patient taking differently: Take 400 mg by mouth every 6 (six) hours as needed for headache, mild pain or fever. 02/01/18  Yes Mesner, Corene Cornea, MD  L-Tyrosine 1000 MG TABS Take 1 capsule by mouth daily.   Yes [provider]  Lavender Oil 80 MG CAPS Take 1 capsule by mouth daily.   Yes [provider]  Multiple Vitamins-Minerals (ONE-A-DAY MENS HEALTH FORMULA PO) Take 1 capsule by mouth daily.   Yes [provider]  ondansetron (ZOFRAN ODT) 4 MG disintegrating tablet Take 1 tablet (4 mg total) by mouth every 8 (eight) hours as needed for nausea or vomiting. 10/28/20  Yes Evalee Jefferson, PA-C  ondansetron (ZOFRAN) 4 MG tablet Take 1 tablet (4 mg total) by mouth every 8 (eight) hours as needed for nausea or vomiting. 02/17/20  Yes Hayden Rasmussen, MD  oxyCODONE-acetaminophen (PERCOCET/ROXICET) 5-325 MG tablet Take 1 tablet by mouth every 4 (four) hours as needed. 10/28/20  Yes Idol, Almyra Free, PA-C  sertraline (ZOLOFT) 25 MG tablet Take 25 mg by mouth daily.   Yes [provider]  sertraline (ZOLOFT) 50 MG tablet Take 50 mg by mouth daily.   Yes [provider]  tamsulosin (FLOMAX) 0.4 MG CAPS capsule Take 1 capsule (0.4 mg total) by mouth daily. 02/17/20  Yes Hayden Rasmussen, MD    Allergies    Penicillins  Review of Systems   Review of Systems  Constitutional: Negative for chills and fever.  HENT: Negative.   Eyes: Negative.    Respiratory: Negative for chest tightness and shortness of breath.   Cardiovascular: Negative for chest pain.  Gastrointestinal: Positive for nausea. Negative for abdominal pain and vomiting.  Genitourinary: Positive for decreased urine volume and flank pain. Negative for hematuria.  Musculoskeletal: Negative for arthralgias, joint swelling and neck pain.  Skin: Negative.  Negative for rash and wound.  Neurological: Negative for dizziness, weakness, light-headedness, numbness and headaches.  Psychiatric/Behavioral: Negative.   All other systems reviewed and are negative.   Physical Exam Updated Vital Signs BP 120/71 (BP Location: Left Arm)   Pulse 84   Temp 97.8 F (36.6 C) (Oral)   Resp 18   Ht 5\' 9"  (1.753 m)   Wt 95.3 kg   SpO2 96%   BMI 31.01 kg/m   Physical Exam Vitals and nursing note reviewed.  Constitutional:      Appearance: He is well-developed.  HENT:     Head: Normocephalic and atraumatic.  Eyes:     Conjunctiva/sclera: Conjunctivae normal.  Cardiovascular:     Rate and Rhythm: Normal rate and regular rhythm.     Heart sounds: Normal heart sounds.  Pulmonary:     Effort: Pulmonary effort is normal.     Breath sounds: Normal breath sounds. No wheezing.  Abdominal:     General: Bowel sounds are normal.     Palpations: Abdomen is soft.     Tenderness: There is no abdominal tenderness. There is left CVA tenderness.  Musculoskeletal:        General: Normal range of motion.     Cervical back: Normal range of motion.  Skin:    General: Skin is warm and dry.  Neurological:     Mental Status: He is alert.     ED Results / Procedures / Treatments   Labs (all labs ordered are listed, but only abnormal results are displayed) Labs Reviewed  CBC WITH DIFFERENTIAL/PLATELET - Abnormal; Notable for the following components:      Result Value   WBC 10.7 (*)    Neutro Abs 9.7 (*)    Lymphs Abs 0.6 (*)    All other components within normal limits  BASIC  METABOLIC PANEL - Abnormal; Notable for the following components:   Glucose, Bld 125 (*)    All other components within normal limits  URINALYSIS, ROUTINE W REFLEX MICROSCOPIC - Abnormal; Notable for the following components:   APPearance TURBID (*)    Hgb urine dipstick LARGE (*)    All other components within normal limits    EKG None  Radiology CT Renal Stone Study  Result Date: 10/28/2020 CLINICAL DATA:  Left flank pain with nausea EXAM: CT  ABDOMEN AND PELVIS WITHOUT CONTRAST TECHNIQUE: Multidetector CT imaging of the abdomen and pelvis was performed following the standard protocol without IV contrast. COMPARISON:  CT abdomen and pelvis February 17, 2020 FINDINGS: Lower chest: No acute abnormality. Normal size heart. No significant pericardial effusion. Hepatobiliary: Unremarkable noncontrast appearance of the hepatic parenchyma. Gallbladder is unremarkable. No biliary ductal dilation. Pancreas: Unremarkable. No pancreatic ductal dilatation or surrounding inflammatory changes. Spleen: Normal in size without focal abnormality. Adrenals/Urinary Tract: Bilateral adrenal glands are unremarkable. No hydronephrosis. Bilateral nonobstructive kidney stones measuring approximately 2-3 mm. There is left-sided perinephric and periureteric stranding with a 2 mm stone in the distal left ureter just proximal to the UVJ on image 82/2. Bilateral renal lesions which were characterized as cysts on MRI abdomen April 24, 2020. No new suspicious contour deforming renal lesions. Urinary bladder is decompressed limiting evaluation. Stomach/Bowel: Stomach is within normal limits. Tiny 1-2 mm appendicoliths on image 51/2 without evidence of appendiceal inflammation. No evidence of bowel wall thickening, distention, or inflammatory changes. Vascular/Lymphatic: Scattered aortic atherosclerosis. No enlarged abdominal or pelvic lymph nodes. Reproductive: Prostate is unremarkable. Other: No abdominopelvic ascites.  Musculoskeletal: No acute or significant osseous findings. IMPRESSION: 1. Nonobstructive 2 mm stone in the distal LEFT ureter just proximal to the UVJ. Mild fullness/distension of the LEFT ureter and collecting system with adjacent perinephric and periureteric stranding likely sequela of passing renal stone. No discrete hydronephrosis. 2. Bilateral nonobstructive kidney stones measuring approximately 2-3 mm. 3. Aortic atherosclerosis. Aortic Atherosclerosis (ICD10-I70.0). Electronically Signed   By: Dahlia Bailiff MD   On: 10/28/2020 10:19    Procedures Procedures   Medications Ordered in ED Medications  sodium chloride 0.9 % bolus 1,000 mL (0 mLs Intravenous Stopped 10/28/20 1343)  ondansetron (ZOFRAN) injection 4 mg (4 mg Intravenous Given 10/28/20 1044)  HYDROmorphone (DILAUDID) injection 1 mg (1 mg Intravenous Given 10/28/20 1044)  fluconazole (DIFLUCAN) tablet 150 mg (150 mg Oral Given 10/28/20 1203)  ketorolac (TORADOL) 30 MG/ML injection 15 mg (15 mg Intravenous Given 10/28/20 1203)    ED Course  I have reviewed the triage vital signs and the nursing notes.  Pertinent labs & imaging results that were available during my care of the patient were reviewed by me and considered in my medical decision making (see chart for details).    MDM Rules/Calculators/A&P                          Imaging and labs reviewed and discussed with pt and wife at bedside.  No urinary sx prior to onset of flank pain today.  He has recently been on doxycycline for an infected skin rash, but denies h/o yeast infection.  He was given one time dose of diflucan to treat the budding seen in UA.  Distal ureteral stone without significant hydronephrosis.  Oxycodone and zofran prescribed, advised to strain urine, retaining stone for urology - referral given for f/u care.  Pain free after receiving toradol dose.  Pt already has flomax from kidney stone event last summer.  He will continue taking this as well.  Strict return  precautions outlined.  Final Clinical Impression(s) / ED Diagnoses Final diagnoses:  Left ureteral stone    Rx / DC Orders ED Discharge Orders         Ordered    oxyCODONE-acetaminophen (PERCOCET/ROXICET) 5-325 MG tablet  Every 4 hours PRN        10/28/20 1309    ondansetron (ZOFRAN ODT) 4 MG disintegrating  tablet  Every 8 hours PRN        10/28/20 1309           Evalee Jefferson, Hershal Coria 10/28/20 1418    Sherwood Gambler, MD 10/29/20 [phone]

## 2020-10-28 NOTE — Discharge Instructions (Addendum)
Continue treating your symptoms as needed.  Get rechecked immediately for any uncontrolled pain, vomiting or if you develop fever.  Strain your urine and save the stone if you pass it.  Call the urology group listed above for office follow-up care.  Do not drive within 4 hours  of taking oxycodone as this medication will make you drowsy.

## 2020-10-31 ENCOUNTER — Telehealth: Payer: Self-pay | Admitting: Emergency Medicine

## 2020-10-31 NOTE — Telephone Encounter (Signed)
Called pts wife, Janett Billow.  She states that the pt has had no relief with pain medication at home.  The pain has moved from flank area to his side.  He usually passes the stones before now.  RN explained that he would need to be seen in ER for possible blockage of kidney stones since pain is not improving and getting worse. Verbalized understanding.

## 2020-11-01 ENCOUNTER — Ambulatory Visit: Payer: Self-pay | Admitting: Urology

## 2021-11-29 IMAGING — CT CT RENAL STONE PROTOCOL
2 of 4 series · 16 of 46 positions shown, 18 images · non-contrast
Comparison: CT abdomen and pelvis February 17, 2020

CLINICAL DATA: Left flank pain with nausea

EXAM:
CT ABDOMEN AND PELVIS WITHOUT CONTRAST
TECHNIQUE: Multidetector CT imaging of the abdomen and pelvis was performed
following the standard protocol without IV contrast.

[Series 2: axial st · axial · 0.87mm/px · z∈[+650,+1116]mm · 13 of 105 slices shown, 15 images]
[im 6/105  soft-tissue]
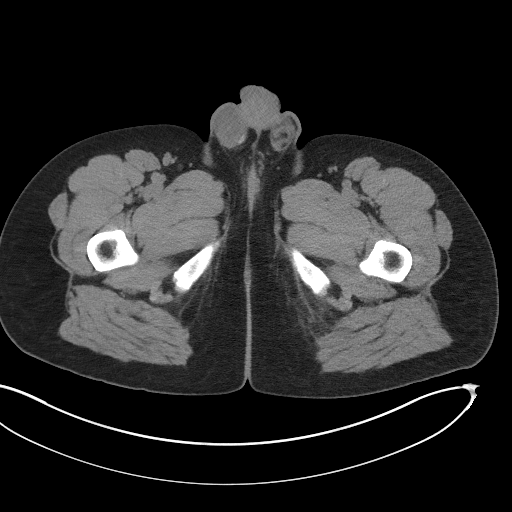
[im 6/105  bone]
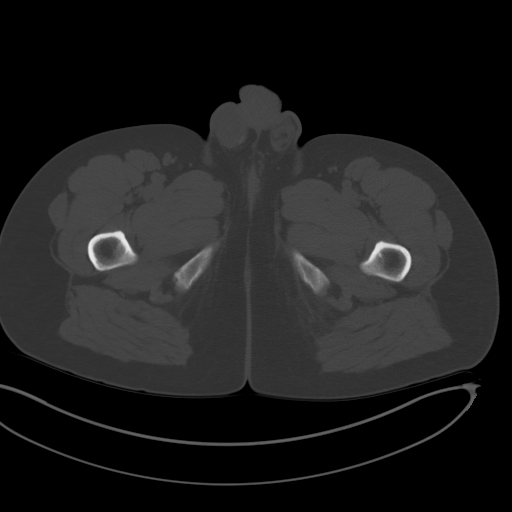
[im 12/105  soft-tissue]
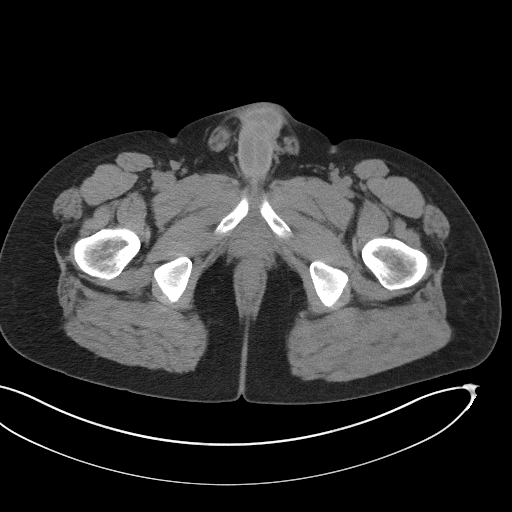
[im 24/105  soft-tissue]
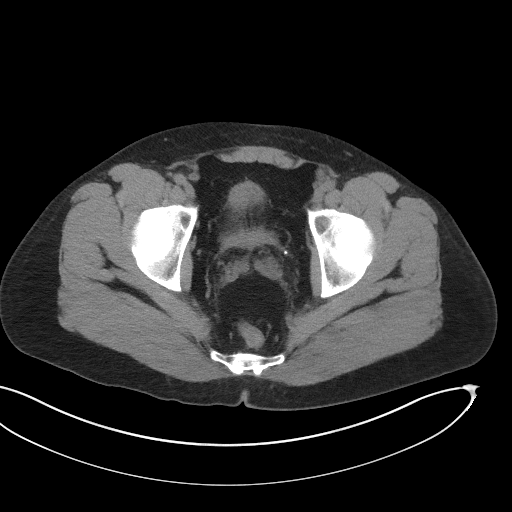
[im 29/105  soft-tissue]
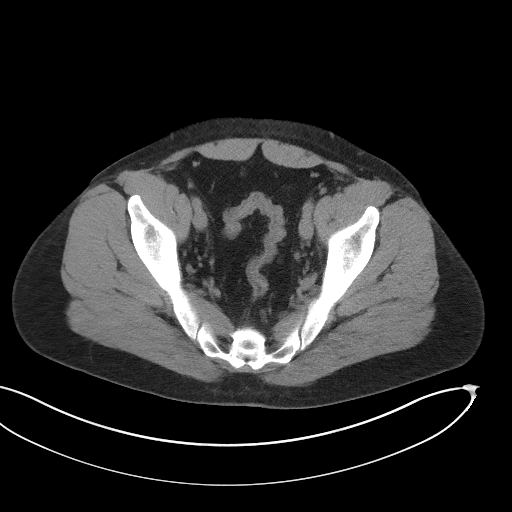
[im 35/105  soft-tissue]
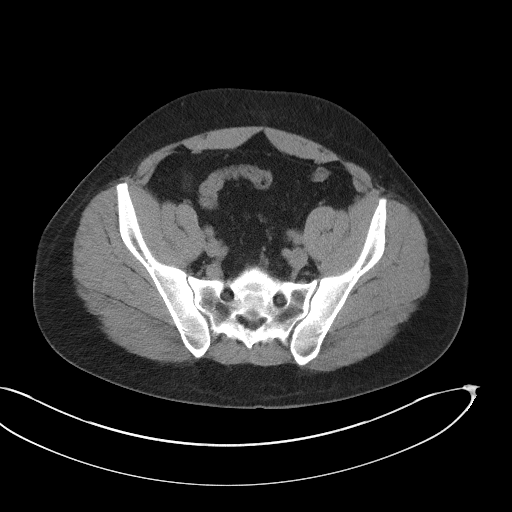
[im 47/105  soft-tissue]
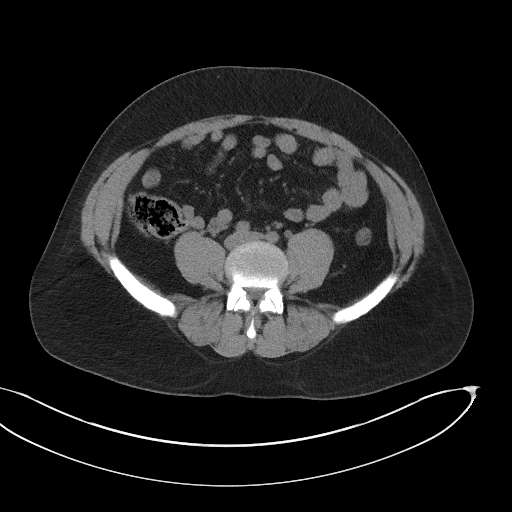
[im 53/105  soft-tissue]
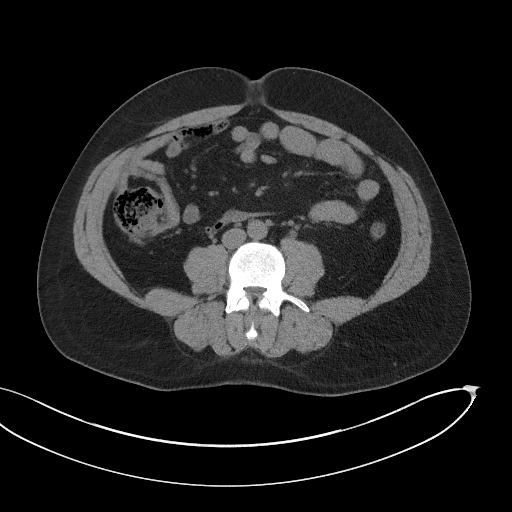
[im 58/105  soft-tissue]
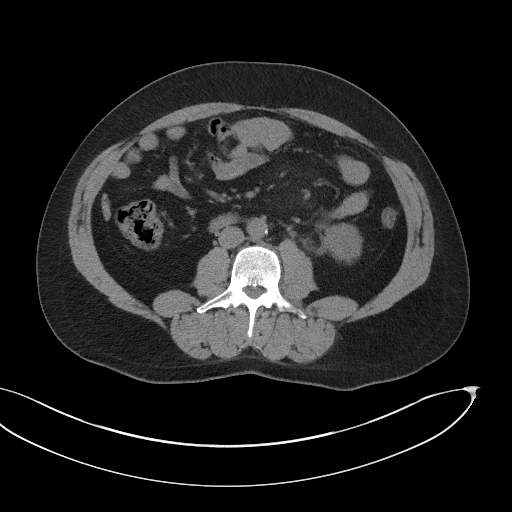
[im 70/105  soft-tissue]
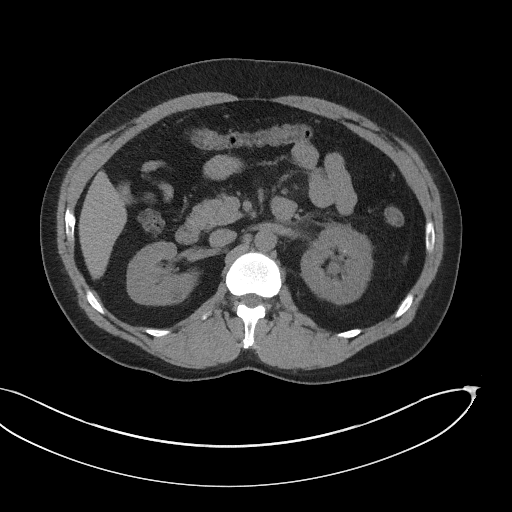
[im 70/105  bone]
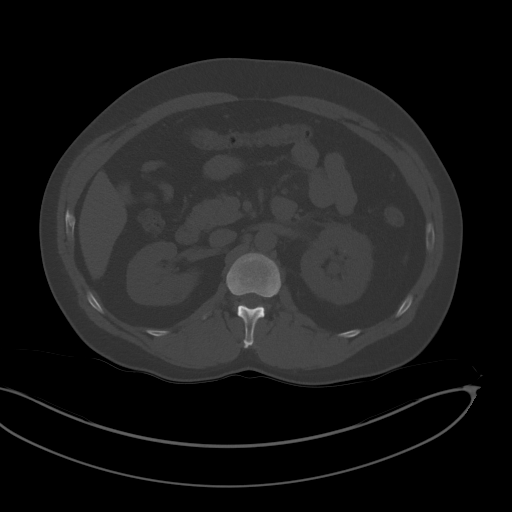
[im 76/105  soft-tissue]
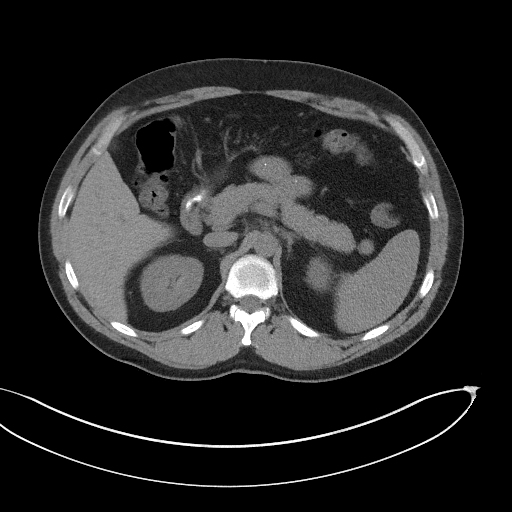
[im 81/105  soft-tissue]
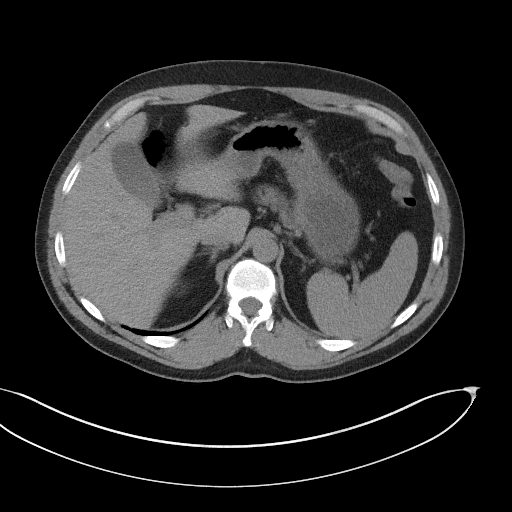
[im 93/105  soft-tissue]
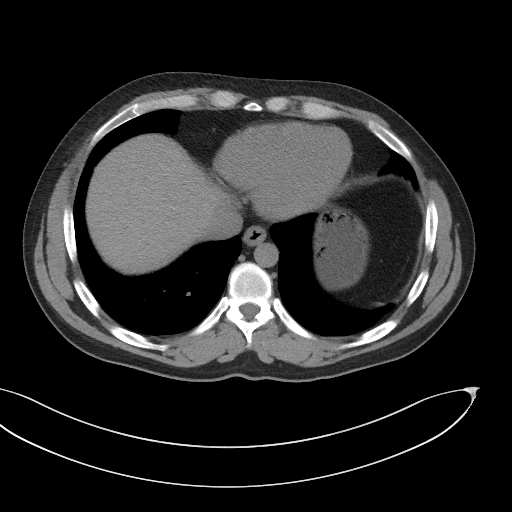
[im 99/105  soft-tissue]
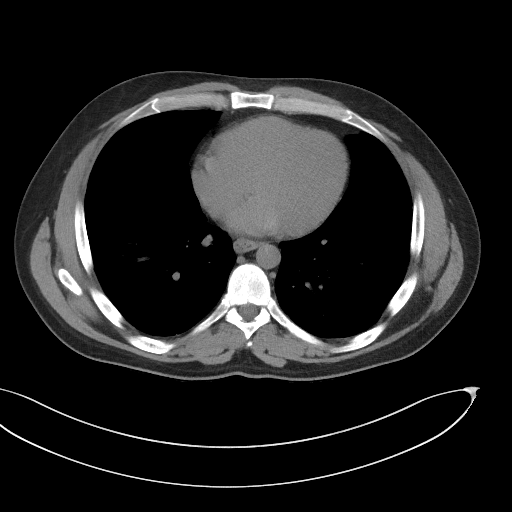

[Series 5: coronal st · coronal · 0.84mm/px · 3 of 116 slices shown]
[im 39/116  soft-tissue]
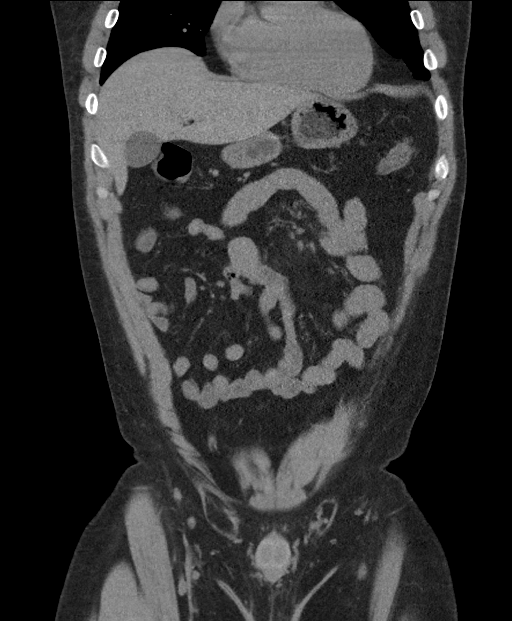
[im 52/116  soft-tissue]
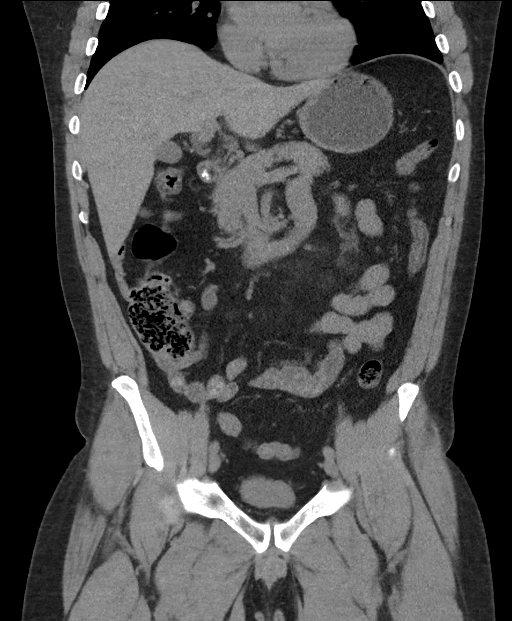
[im 64/116  soft-tissue]
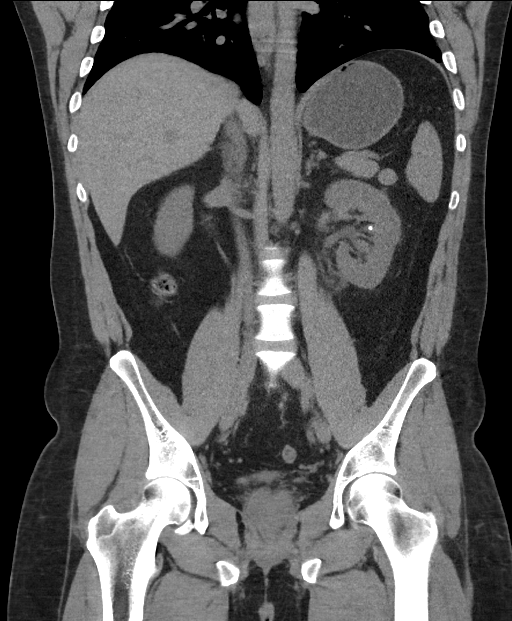

[16 of 46 positions shown; findings below may reference images not displayed]

FINDINGS: Lower chest: No acute abnormality. Normal size heart. No significant
pericardial effusion.

Hepatobiliary: Unremarkable noncontrast appearance of the hepatic
parenchyma. Gallbladder is unremarkable. No biliary ductal dilation.

Pancreas: Unremarkable. No pancreatic ductal dilatation or
surrounding inflammatory changes.

Spleen: Normal in size without focal abnormality.

Adrenals/Urinary Tract: Bilateral adrenal glands are unremarkable.

No hydronephrosis. Bilateral nonobstructive kidney stones measuring
approximately 2-3 mm. There is left-sided perinephric and
periureteric stranding with a 2 mm stone in the distal left ureter
just proximal to the UVJ on image 82/2. Bilateral renal lesions
which were characterized as cysts on MRI abdomen April 24, 2020.
No new suspicious contour deforming renal lesions.

Urinary bladder is decompressed limiting evaluation.

Stomach/Bowel: Stomach is within normal limits. Tiny 1-2 mm
appendicoliths on image 51/2 without evidence of appendiceal
inflammation. No evidence of bowel wall thickening, distention, or
inflammatory changes.

Vascular/Lymphatic: Scattered aortic atherosclerosis. No enlarged
abdominal or pelvic lymph nodes.

Reproductive: Prostate is unremarkable.

Other: No abdominopelvic ascites.

Musculoskeletal: No acute or significant osseous findings.
IMPRESSION: 1. Nonobstructive 2 mm stone in the distal LEFT ureter just proximal
to the UVJ. Mild fullness/distension of the LEFT ureter and
collecting system with adjacent perinephric and periureteric
stranding likely sequela of passing renal stone. No discrete
hydronephrosis.
2. Bilateral nonobstructive kidney stones measuring approximately
2-3 mm.
3. Aortic atherosclerosis.

Aortic Atherosclerosis ([id number]).

## 2023-07-13 ENCOUNTER — Ambulatory Visit (INDEPENDENT_AMBULATORY_CARE_PROVIDER_SITE_OTHER): Payer: Self-pay | Admitting: Podiatry

## 2023-07-13 ENCOUNTER — Ambulatory Visit: Payer: Self-pay | Admitting: Podiatry

## 2023-07-13 ENCOUNTER — Encounter: Payer: Self-pay | Admitting: Podiatry

## 2023-07-13 DIAGNOSIS — L408 Other psoriasis: Secondary | ICD-10-CM

## 2023-07-13 MED ORDER — CLOBETASOL PROPIONATE 0.05 % EX OINT: 1.0000 | TOPICAL_OINTMENT | Freq: Two times a day (BID) | CUTANEOUS | 4 refills | Status: DC

## 2023-07-13 NOTE — Progress Notes (Signed)
  Subjective:  Patient ID: [name], male    DOB: [birth year],  MRN: [id number]  Chief Complaint  Patient presents with   DRY FEET    PATIENT STATES HIS R FOOT HIS SKIN IS CRACKING AND PEELING ON THE BOOTOM OF BILATERAL FEET. AND HE PUTS CREAM AND LOTION ON IT AND IT CAME RIGHT BACK , PATIENT THOUGHT IT WAS ATHLETE FEET BUT HE IS NOT SURE, PATIENT STATES THAT IT HAS BEEN GOING ON FOR 3 YEARS . NO MEDICATION FOR PAIN.     Discussed the use of AI scribe software for clinical note transcription with the patient, who gave verbal consent to proceed.  History of Present Illness   The patient, with a longstanding history of athlete's foot, presents with a persistent foot and hand rash for the past two and a half years. The rash, characterized by cracking and intense itching, is predominantly on the right foot and right hand. The patient has tried various over-the-counter creams, which provide temporary relief before the rash worsens. The patient also reports a recent flare-up after a beach trip, which caused significant discomfort and difficulty walking. The patient has not been previously diagnosed with eczema or psoriasis.          Objective:    Physical Exam   CARDIOVASCULAR: Palpable pulses, good capillary refill time. SKIN: Dry, cracking, erythematous plaque eruption on plantar medial arch and medial foot and ankle bilaterally; similar eruption on right palm.       No images are attached to the encounter.    Results          Assessment:   1. Palmoplantar psoriasis      Plan:  Patient was evaluated and treated and all questions answered.  Assessment and Plan    Palmo-Plantar Psoriasis He exhibits dry, cracking, erythematous plaques on the plantar medial arch and medial foot and ankle bilaterally, and on the right palm, persisting for approximately 2.5 years with periods of exacerbation and remission. Despite using over-the-counter creams and a moisturizer, he has seen  limited success. We will prescribe Temovate, a high potency steroid, to be applied twice daily and advise him to continue using the moisturizer after applying the steroid ointment. We will check progress in 1 month, at which point, if there is no improvement, we will consider adding Protopic, a biologic. A follow-up visit is scheduled in 3 months; if the condition has completely resolved, he can cancel the appointment and follow up as needed. If there is no improvement with the prescribed treatment, a skin biopsy will be considered.          Return in about 3 months (around 10/11/2023) for f/u on psoriasis.

## 2023-07-13 NOTE — Patient Instructions (Signed)
VISIT SUMMARY:  During today's visit, we discussed your persistent foot and hand rash, which has been troubling you for the past two and a half years. We reviewed your symptoms, including the cracking and intense itching, and your previous attempts to manage the condition with over-the-counter creams.  YOUR PLAN:  -PALMO-PLANTAR PSORIASIS: Palmo-Plantar Psoriasis is a chronic skin condition characterized by dry, cracking, and red plaques on the palms of the hands and soles of the feet. We will start treatment with Temovate, a high potency steroid, to be applied twice daily. Continue using your moisturizer after applying the steroid ointment. We will review your progress in 1 month, and if there is no improvement, we may add Protopic, a biologic medication. A follow-up visit is scheduled in 3 months, but if your condition resolves completely, you can cancel the appointment and follow up as needed. If there is no improvement, we may consider a skin biopsy.   INSTRUCTIONS:  Please apply Temovate twice daily to the affected areas and continue using your moisturizer after the steroid ointment. We will check your progress in 1 month. If there is no improvement, we may add Protopic. Your follow-up visit is scheduled in 3 months; if your condition resolves completely, you can cancel the appointment and follow up as needed. If there is no improvement, we may consider a skin biopsy.

## 2023-10-12 ENCOUNTER — Ambulatory Visit: Payer: 59 | Admitting: Podiatry

## 2023-12-08 DIAGNOSIS — E559 Vitamin D deficiency, unspecified: Secondary | ICD-10-CM | POA: Insufficient documentation

## 2023-12-08 DIAGNOSIS — E782 Mixed hyperlipidemia: Secondary | ICD-10-CM | POA: Insufficient documentation

## 2023-12-15 ENCOUNTER — Encounter: Payer: Self-pay | Admitting: *Deleted

## 2024-02-17 ENCOUNTER — Telehealth: Payer: Self-pay

## 2024-02-17 NOTE — Telephone Encounter (Signed)
 Who is your primary care physician: Charmaine Heller  Reasons for the colonoscopy: screening  Have you had a colonoscopy before?  no  Do you have family history of colon cancer? no  Previous colonoscopy with polyps removed? no  Do you have a history colorectal cancer?   no  Are you diabetic? If yes, Type 1 or Type 2?    no  Do you have a prosthetic or mechanical heart valve? no  Do you have a pacemaker/defibrillator?   no  Have you had endocarditis/atrial fibrillation? no  Have you had joint replacement within the last 12 months?  no  Do you tend to be constipated or have to use laxatives? no  Do you have any history of drugs or alchohol?  no  Do you use supplemental oxygen?  no  Have you had a stroke or heart attack within the last 6 months? no  Do you take weight loss medication?  no    Do you take any blood-thinning medications such as: (aspirin, warfarin, Plavix, Aggrenox)  no  If yes we need the name, milligram, dosage and who is prescribing doctor  Current Outpatient Medications on File Prior to Visit  Medication Sig Dispense Refill   atorvastatin (LIPITOR) 40 MG tablet Take 1 tablet by mouth daily.     cetirizine (ZYRTEC) 10 MG tablet Take 10 mg by mouth daily.     clobetasol  ointment (TEMOVATE ) 0.05 % Apply 1 Application topically 2 (two) times daily. 60 g 4   doxycycline (ADOXA) 100 MG tablet Take 100 mg by mouth daily.     ibuprofen  (ADVIL ,MOTRIN ) 400 MG tablet Take 1 tablet (400 mg total) by mouth every 6 (six) hours as needed. (Patient taking differently: Take 400 mg by mouth every 6 (six) hours as needed for headache, mild pain (pain score 1-3) or fever.) 30 tablet 0   L-Tyrosine 1000 MG TABS Take 1 capsule by mouth daily.     Lavender Oil 80 MG CAPS Take 1 capsule by mouth daily.     Multiple Vitamins-Minerals (ONE-A-DAY MENS HEALTH FORMULA PO) Take 1 capsule by mouth daily.     ondansetron  (ZOFRAN  ODT) 4 MG disintegrating tablet Take 1 tablet (4 mg total)  by mouth every 8 (eight) hours as needed for nausea or vomiting. 20 tablet 0   ondansetron  (ZOFRAN ) 4 MG tablet Take 1 tablet (4 mg total) by mouth every 8 (eight) hours as needed for nausea or vomiting. 15 tablet 0   oxyCODONE -acetaminophen  (PERCOCET/ROXICET) 5-325 MG tablet Take 1 tablet by mouth every 4 (four) hours as needed. 15 tablet 0   sertraline (ZOLOFT) 25 MG tablet Take 25 mg by mouth daily.     sertraline (ZOLOFT) 50 MG tablet Take 50 mg by mouth daily.     tamsulosin  (FLOMAX ) 0.4 MG CAPS capsule Take 1 capsule (0.4 mg total) by mouth daily. 30 capsule 0   No current facility-administered medications on file prior to visit.    Allergies  Allergen Reactions   Penicillins Other (See Comments)    Kidney and lung failure     Pharmacy: [facility].  Primary Insurance Name: [facility] [id number]  Best number where you can be reached: [phone]

## 2024-03-31 NOTE — Telephone Encounter (Signed)
 ASA 2 - ok to schedule.

## 2024-04-03 ENCOUNTER — Other Ambulatory Visit: Payer: Self-pay | Admitting: *Deleted

## 2024-04-03 ENCOUNTER — Encounter: Payer: Self-pay | Admitting: *Deleted

## 2024-04-03 ENCOUNTER — Encounter (INDEPENDENT_AMBULATORY_CARE_PROVIDER_SITE_OTHER): Payer: Self-pay | Admitting: *Deleted

## 2024-04-03 MED ORDER — PEG 3350-KCL-NA BICARB-NACL 420 G PO SOLR: 4000.0000 mL | Freq: Once | ORAL | 0 refills | Status: AC

## 2024-04-03 NOTE — Telephone Encounter (Signed)
 Pt has been scheduled with Dr.Carver on 04/18/24. Instructions sent via mychart and prep sent to pharmacy.

## 2024-04-03 NOTE — Telephone Encounter (Signed)
 Referral completed, [facility] apt letter sent to PCP

## 2024-04-18 ENCOUNTER — Encounter (HOSPITAL_COMMUNITY): Payer: Self-pay | Admitting: Internal Medicine

## 2024-04-18 ENCOUNTER — Other Ambulatory Visit: Payer: Self-pay

## 2024-04-18 ENCOUNTER — Encounter (HOSPITAL_COMMUNITY)
Admission: RE | Disposition: A | Discharge status: Home / Self Care | Payer: Self-pay | Source: Home / Self Care | Attending: Internal Medicine

## 2024-04-18 ENCOUNTER — Ambulatory Visit (HOSPITAL_COMMUNITY)
Admission: RE | Admit: 2024-04-18 | Discharge: 2024-04-18 | Disposition: A | Discharge status: Home / Self Care | Attending: Internal Medicine | Admitting: Internal Medicine

## 2024-04-18 ENCOUNTER — Ambulatory Visit (HOSPITAL_BASED_OUTPATIENT_CLINIC_OR_DEPARTMENT_OTHER): Admitting: Certified Registered Nurse Anesthetist

## 2024-04-18 ENCOUNTER — Ambulatory Visit (HOSPITAL_COMMUNITY): Admitting: Certified Registered Nurse Anesthetist

## 2024-04-18 DIAGNOSIS — Z1211 Encounter for screening for malignant neoplasm of colon: Secondary | ICD-10-CM | POA: Diagnosis not present

## 2024-04-18 DIAGNOSIS — K648 Other hemorrhoids: Secondary | ICD-10-CM | POA: Diagnosis not present

## 2024-04-18 HISTORY — PX: COLONOSCOPY: SHX5424

## 2024-04-18 SURGERY — COLONOSCOPY
Anesthesia: General

## 2024-04-18 MED ORDER — PROPOFOL 500 MG/50ML IV EMUL
INTRAVENOUS | Status: DC | PRN
  Administered 2024-04-18: 100 mg via INTRAVENOUS
  Administered 2024-04-18: 150 ug/kg/min via INTRAVENOUS

## 2024-04-18 MED ORDER — LACTATED RINGERS IV SOLN: INTRAVENOUS | Status: DC

## 2024-04-18 MED ORDER — LACTATED RINGERS IV SOLN: INTRAVENOUS | Status: DC | PRN

## 2024-04-18 NOTE — Anesthesia Postprocedure Evaluation (Signed)
 Anesthesia Post Note  Patient: [name]  Procedure(s) Performed: COLONOSCOPY  Patient location during evaluation: PACU Anesthesia Type: General Level of consciousness: awake and alert Pain management: pain level controlled Vital Signs Assessment: post-procedure vital signs reviewed and stable Respiratory status: spontaneous breathing, nonlabored ventilation, respiratory function stable and patient connected to nasal cannula oxygen Cardiovascular status: blood pressure returned to baseline and stable Postop Assessment: no apparent nausea or vomiting Anesthetic complications: no   No notable events documented.   Last Vitals:  Vitals:   04/18/24 0933 04/18/24 0934  BP: (!) 96/56 105/61  Pulse: 63 (!) 58  Resp: 16 16  Temp:    SpO2: 97% 98%    Last Pain:  Vitals:   04/18/24 0928  TempSrc: Oral  PainSc: 0-No pain                 Andrea Limes

## 2024-04-18 NOTE — Addendum Note (Signed)
 Addendum  created 04/18/24 1218 by Herschell Hollering, MD   Clinical Note Signed

## 2024-04-18 NOTE — Op Note (Signed)
 [facility] Patient Name: [name] Procedure Date: 04/18/2024 9:04 AM MRN: [id number] Date of Birth: 1976/06/08 Attending MD: Carlin POUR. Cindie , [place], [id number] CSN: [id number] Age: 48 Admit Type: Outpatient Procedure:                Colonoscopy Indications:              Screening for colorectal malignant neoplasm Providers:                Carlin POUR. Cindie, DO, Crystal Page, Jon Loge Referring MD:              Medicines:                See the Anesthesia note for documentation of the                            administered medications Complications:            No immediate complications. Estimated Blood Loss:     Estimated blood loss: none. Procedure:                Pre-Anesthesia Assessment:                           - The anesthesia plan was to use monitored                            anesthesia care (MAC).                           After obtaining informed consent, the colonoscope                            was passed under direct vision. Throughout the                            procedure, the patient's blood pressure, pulse, and                            oxygen saturations were monitored continuously. The                            PCF-HQ190L ([id number]) Peds Colon was introduced                            through the anus and advanced to the the terminal                            ileum, with identification of the appendiceal                            orifice and IC valve. The colonoscopy was performed                            without difficulty. The patient tolerated the  procedure well. The quality of the bowel                            preparation was evaluated using the BBPS [facility]                            Bowel Preparation Scale) with scores of: Right                            Colon = 3, Transverse Colon = 3 and Left Colon = 3                            (entire mucosa seen well with no residual  staining,                            small fragments of stool or opaque liquid). The                            total BBPS score equals 9. Scope In: 9:16:44 AM Scope Out: 9:26:43 AM Scope Withdrawal Time: 0 hours 6 minutes 27 seconds  Total Procedure Duration: 0 hours 9 minutes 59 seconds  Findings:      Non-bleeding internal hemorrhoids were found.      The terminal ileum appeared normal.      The exam was otherwise without abnormality on direct and retroflexion       views. Impression:               - Non-bleeding internal hemorrhoids.                           - The examined portion of the ileum was normal.                           - The examination was otherwise normal on direct                            and retroflexion views.                           - No specimens collected. Moderate Sedation:      Per Anesthesia Care Recommendation:           - Patient has a contact number available for                            emergencies. The signs and symptoms of potential                            delayed complications were discussed with the                            patient. Return to normal activities tomorrow.                            Written discharge instructions were provided to the  patient.                           - Resume previous diet.                           - Continue present medications.                           - Repeat colonoscopy in 10 years for screening                            purposes.                           - Return to GI clinic PRN. Procedure Code(s):        --- Professional ---                           H9878, Colorectal cancer screening; colonoscopy on                            individual not meeting criteria for high risk Diagnosis Code(s):        --- Professional ---                           Z12.11, Encounter for screening for malignant                            neoplasm of colon                           K64.8,  Other hemorrhoids CPT copyright 2022 American Medical Association. All rights reserved. The codes documented in this report are preliminary and upon coder review may  be revised to meet current compliance requirements. Carlin POUR. Cindie, DO Carlin POUR. Cindie, DO 04/18/2024 9:29:37 AM This report has been signed electronically. Number of Addenda: 0

## 2024-04-18 NOTE — H&P (Signed)
 Primary Care Physician:  Suanne Pfeiffer, NP Primary Gastroenterologist:  Dr. Cindie  Pre-Procedure History & Physical: HPI:  [name] is a 48 y.o. male is here for first ever colonoscopy for colon cancer screening purposes.  Patient denies any family history of colorectal cancer.  No melena or hematochezia.  No abdominal pain or unintentional weight loss.  No change in bowel habits.  Overall feels well from a GI standpoint.  Past Medical History:  Diagnosis Date   Abnormal ECG 09/10/2014   Kidney stone    LPRD (laryngopharyngeal reflux disease) 07/06/2016   Mixed hyperlipidemia 12/08/2023   Pharyngoesophageal dysphagia 07/06/2016   Renal disorder    Vitamin D insufficiency 12/08/2023    Past Surgical History:  Procedure Laterality Date   EYE SURGERY Bilateral 1999   Lasik   LUNG SURGERY     from reaction to penicillin   VASECTOMY      Prior to Admission medications   Not on File    Allergies as of 04/03/2024 - Review Complete 02/17/2024  Allergen Reaction Noted   Penicillins Other (See Comments) 06/02/2012    Family History  Problem Relation Age of Onset   Muscular dystrophy Mother    Arthritis Mother    Early death Mother    Stroke Mother    Diabetes Father    Heart disease Father    Colon cancer Neg Hx    Colon polyps Neg Hx    Esophageal cancer Neg Hx    Rectal cancer Neg Hx    Stomach cancer Neg Hx     Social History   Socioeconomic History   Marital status: Married    Spouse name: Harlene   Number of children: 2   Years of education: Not on file   Highest education level: Not on file  Occupational History   Occupation: Theatre stage manager- [employer]  Tobacco Use   Smoking status: Never   Smokeless tobacco: Never  Vaping Use   Vaping status: Never Used  Substance and Sexual Activity   Alcohol use: Yes    Alcohol/week: 6.0 standard drinks of alcohol    Types: 6 Cans of beer per week    Comment: occ   Drug use: No   Sexual activity: Yes     Partners: Female    Birth control/protection: Surgical  Other Topics Concern   Not on file  Social History Narrative   Not on file   Social Drivers of Health   Financial Resource Strain: Low Risk  (12/08/2023)   Received from [facility]   Overall Financial Resource Strain (CARDIA)    Difficulty of Paying Living Expenses: Not hard at all  Food Insecurity: No Food Insecurity (12/08/2023)   Received from [facility]   Hunger Vital Sign    Within the past 12 months, you worried that your food would run out before you got the money to buy more.: Never true    Within the past 12 months, the food you bought just didn't last and you didn't have money to get more.: Never true  Transportation Needs: No Transportation Needs (12/08/2023)   Received from [facility] - Transportation    Lack of Transportation (Medical): No    Lack of Transportation (Non-Medical): No  Physical Activity: Not on file  Stress: Not on file  Social Connections: Unknown (09/10/2022)   Received from [facility]   Social Network    Social Network: Not on file  Intimate Partner Violence: Unknown (09/10/2022)  Received from [facility]   HITS    Physically Hurt: Not on file    Insult or Talk Down To: Not on file    Threaten Physical Harm: Not on file    Scream or Curse: Not on file    Review of Systems: See HPI, otherwise negative ROS  Physical Exam: Vital signs in last 24 hours: Temp:  [98.6 F (37 C)] 98.6 F (37 C) (09/09 0847) Pulse Rate:  [61] 61 (09/09 0847) Resp:  [13] 13 (09/09 0847) BP: (121)/(82) 121/82 (09/09 0847) SpO2:  [100 %] 100 % (09/09 0847) Weight:  [84.8 kg] 84.8 kg (09/09 0839)   General:   Alert,  Well-developed, well-nourished, pleasant and cooperative in NAD Head:  Normocephalic and atraumatic. Eyes:  Sclera clear, no icterus.   Conjunctiva pink. Ears:  Normal auditory acuity. Nose:  No deformity, discharge,  or lesions. Msk:  Symmetrical without gross  deformities. Normal posture. Extremities:  Without clubbing or edema. Neurologic:  Alert and  oriented x4;  grossly normal neurologically. Skin:  Intact without significant lesions or rashes. Psych:  Alert and cooperative. Normal mood and affect.  Impression/Plan: [name] is here for a colonoscopy to be performed for colon cancer screening purposes.  The risks of the procedure including infection, bleed, or perforation as well as benefits, limitations, alternatives and imponderables have been reviewed with the patient. Questions have been answered. All parties agreeable.

## 2024-04-18 NOTE — Transfer of Care (Signed)
 Immediate Anesthesia Transfer of Care Note  Patient: [name]  Procedure(s) Performed: COLONOSCOPY  Patient Location: Endoscopy Unit  Anesthesia Type:General  Level of Consciousness: awake  Airway & Oxygen Therapy: Patient Spontanous Breathing  Post-op Assessment: Report given to RN and Post -op Vital signs reviewed and stable  Post vital signs: Reviewed and stable  Last Vitals:  Vitals Value Taken Time  BP 83/42   Temp 36.5 C 04/18/24 09:28  Pulse 57 04/18/24 09:28  Resp 16 04/18/24 09:28  SpO2 98 % 04/18/24 09:28    Last Pain:  Vitals:   04/18/24 0928  TempSrc: Oral  PainSc:       Patients Stated Pain Goal: 6 (04/18/24 0839)  Complications: No notable events documented.

## 2024-04-18 NOTE — Discharge Instructions (Addendum)

## 2024-04-18 NOTE — Anesthesia Preprocedure Evaluation (Addendum)
 Anesthesia Evaluation  Patient identified by MRN, date of birth, ID band Patient awake    Reviewed: Allergy & Precautions, H&P , NPO status , Patient's Chart, lab work & pertinent test results  Airway Mallampati: II  TM Distance: >3 FB Neck ROM: Full    Dental no notable dental hx.    Pulmonary neg pulmonary ROS   Pulmonary exam normal breath sounds clear to auscultation       Cardiovascular negative cardio ROS Normal cardiovascular exam Rhythm:Regular Rate:Normal     Neuro/Psych negative neurological ROS  negative psych ROS   GI/Hepatic negative GI ROS, Neg liver ROS,,,  Endo/Other  negative endocrine ROS    Renal/GU negative Renal ROS  negative genitourinary   Musculoskeletal negative musculoskeletal ROS (+)    Abdominal   Peds negative pediatric ROS (+)  Hematology negative hematology ROS (+)   Anesthesia Other Findings   Reproductive/Obstetrics negative OB ROS                             Anesthesia Physical Anesthesia Plan  ASA: 1  Anesthesia Plan: General   Post-op Pain Management:    Induction: Intravenous  PONV Risk Score and Plan:   Airway Management Planned: Nasal Cannula  Additional Equipment:   Intra-op Plan:   Post-operative Plan:   Informed Consent: I have reviewed the patients History and Physical, chart, labs and discussed the procedure including the risks, benefits and alternatives for the proposed anesthesia with the patient or authorized representative who has indicated his/her understanding and acceptance.     Dental advisory given  Plan Discussed with: CRNA  Anesthesia Plan Comments:        Anesthesia Quick Evaluation

## 2024-04-19 ENCOUNTER — Encounter (HOSPITAL_COMMUNITY): Payer: Self-pay | Admitting: Internal Medicine

## 2024-07-11 ENCOUNTER — Other Ambulatory Visit: Payer: Self-pay | Admitting: Nurse Practitioner

## 2024-07-11 ENCOUNTER — Ambulatory Visit
Admission: RE | Admit: 2024-07-11 | Discharge: 2024-07-11 | Disposition: A | Source: Ambulatory Visit | Attending: Nurse Practitioner | Admitting: Nurse Practitioner

## 2024-07-11 DIAGNOSIS — Z0000 Encounter for general adult medical examination without abnormal findings: Secondary | ICD-10-CM
